# Patient Record
Sex: Female | Born: 1960 | Race: White | Hispanic: No | Marital: Married | Smoking: Former smoker
Health system: Southern US, Community
[De-identification: ages and names within clinical notes are randomized; demographics above are authoritative.]

## PROBLEM LIST (undated history)

## (undated) DIAGNOSIS — Z808 Family history of malignant neoplasm of other organs or systems: Secondary | ICD-10-CM

## (undated) DIAGNOSIS — F32A Depression, unspecified: Secondary | ICD-10-CM

## (undated) DIAGNOSIS — Z800 Family history of malignant neoplasm of digestive organs: Secondary | ICD-10-CM

## (undated) DIAGNOSIS — C50919 Malignant neoplasm of unspecified site of unspecified female breast: Secondary | ICD-10-CM

## (undated) DIAGNOSIS — F419 Anxiety disorder, unspecified: Secondary | ICD-10-CM

## (undated) DIAGNOSIS — F329 Major depressive disorder, single episode, unspecified: Secondary | ICD-10-CM

## (undated) DIAGNOSIS — Z8049 Family history of malignant neoplasm of other genital organs: Secondary | ICD-10-CM

## (undated) DIAGNOSIS — I10 Essential (primary) hypertension: Secondary | ICD-10-CM

## (undated) HISTORY — PX: OTHER SURGICAL HISTORY: SHX169

## (undated) HISTORY — PX: MASTECTOMY: SHX3

## (undated) HISTORY — DX: Family history of malignant neoplasm of other organs or systems: Z80.8

## (undated) HISTORY — DX: Family history of malignant neoplasm of other genital organs: Z80.49

## (undated) HISTORY — DX: Family history of malignant neoplasm of digestive organs: Z80.0

## (undated) HISTORY — DX: Anxiety disorder, unspecified: F41.9

## (undated) HISTORY — PX: KNEE ARTHROSCOPY: SUR90

## (undated) HISTORY — DX: Depression, unspecified: F32.A

## (undated) HISTORY — PX: TONSILLECTOMY: SUR1361

---
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
## 2019-12-05 ENCOUNTER — Other Ambulatory Visit: Payer: Self-pay | Admitting: Unknown Physician Specialty

## 2019-12-05 DIAGNOSIS — R921 Mammographic calcification found on diagnostic imaging of breast: Secondary | ICD-10-CM

## 2019-12-05 DIAGNOSIS — N6310 Unspecified lump in the right breast, unspecified quadrant: Secondary | ICD-10-CM

## 2019-12-20 ENCOUNTER — Ambulatory Visit
Admission: RE | Admit: 2019-12-20 | Discharge: 2019-12-20 | Disposition: A | Discharge status: Home / Self Care | Payer: BC Managed Care – PPO | Source: Ambulatory Visit | Attending: Unknown Physician Specialty | Admitting: Unknown Physician Specialty

## 2019-12-20 ENCOUNTER — Other Ambulatory Visit: Payer: Self-pay

## 2019-12-20 ENCOUNTER — Other Ambulatory Visit: Payer: Self-pay | Admitting: Unknown Physician Specialty

## 2019-12-20 DIAGNOSIS — R921 Mammographic calcification found on diagnostic imaging of breast: Secondary | ICD-10-CM

## 2019-12-20 DIAGNOSIS — N6310 Unspecified lump in the right breast, unspecified quadrant: Secondary | ICD-10-CM

## 2019-12-20 IMAGING — US US  BREAST BX W/ LOC DEV 1ST LESION IMG BX SPEC US GUIDE*R*
1 series · 12 of 18 positions shown · non-contrast
Comparison: Previous exam(s).
COMPARISON: Previous exam(s).

Addendum:
CLINICAL DATA: Patient with indeterminate right breast masses.

EXAM:
ULTRASOUND GUIDED RIGHT BREAST CORE NEEDLE BIOPSY

[Series 1: us breast bx w/ loc dev 1st lesion img bx spec us  · 0.06mm/px · 12 of 18 slices shown]
[im 1/18]
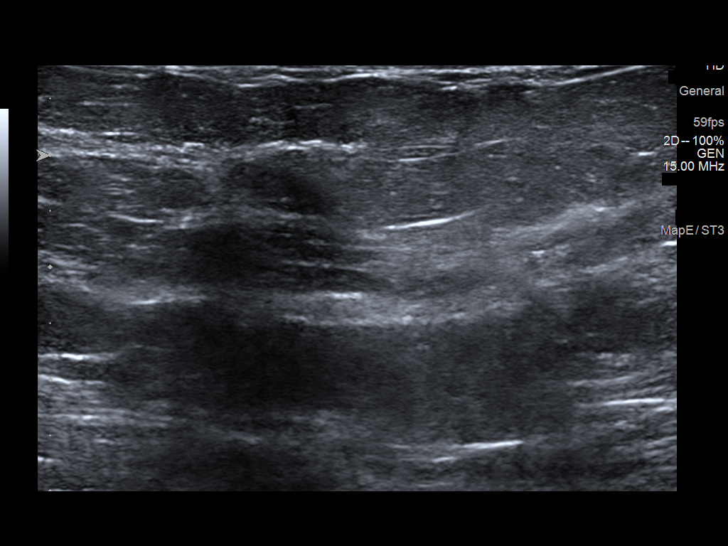
[im 3/18]
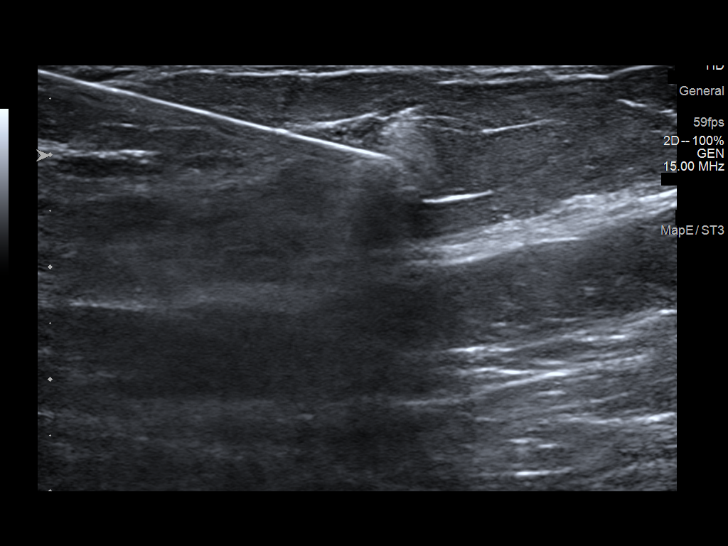
[im 4/18]
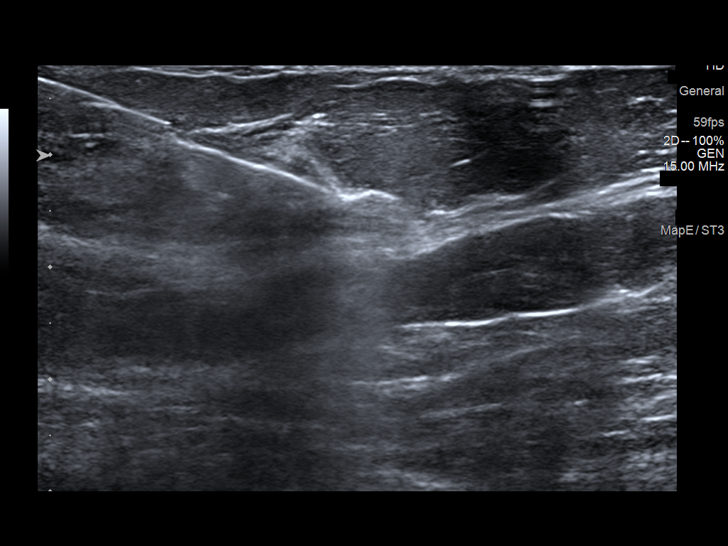
[im 6/18]
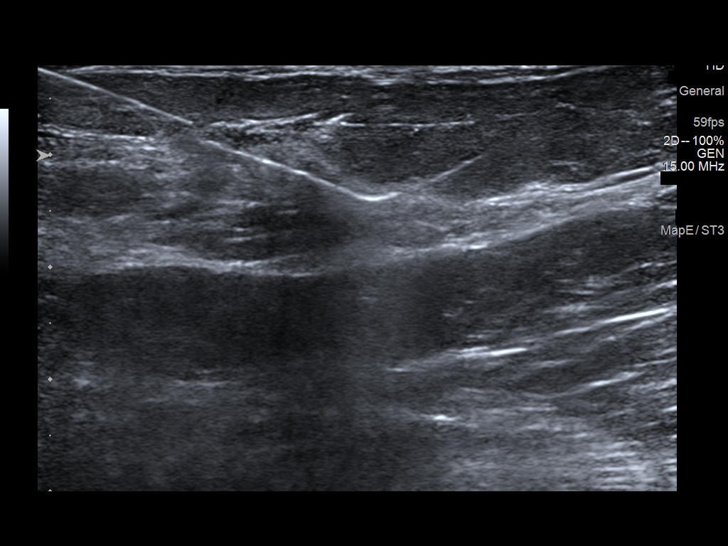
[im 7/18]
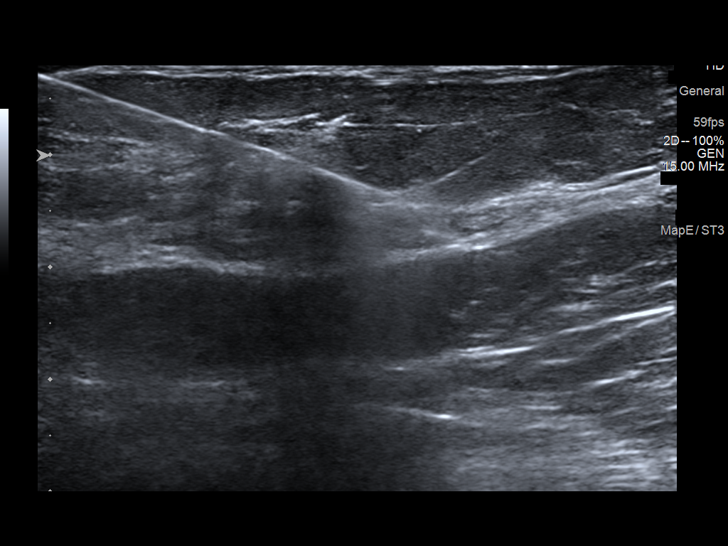
[im 9/18]
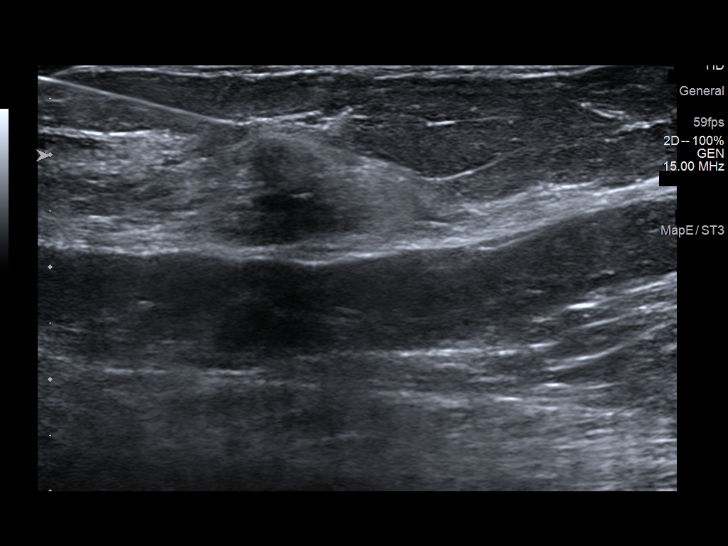
[im 10/18]
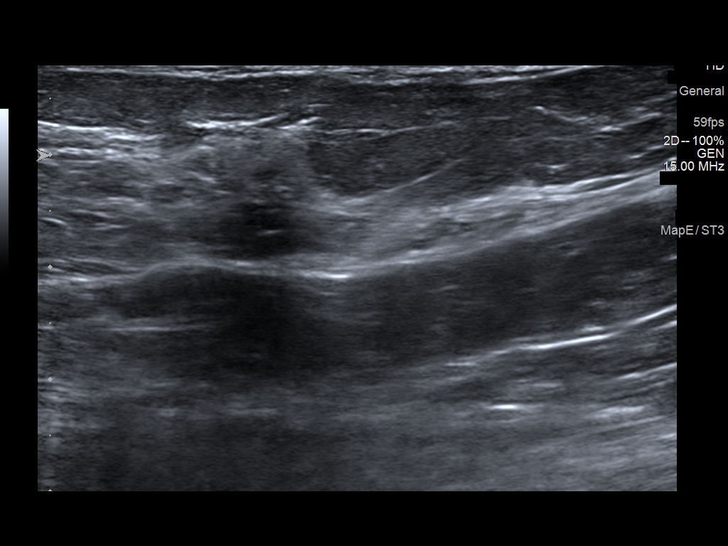
[im 12/18]
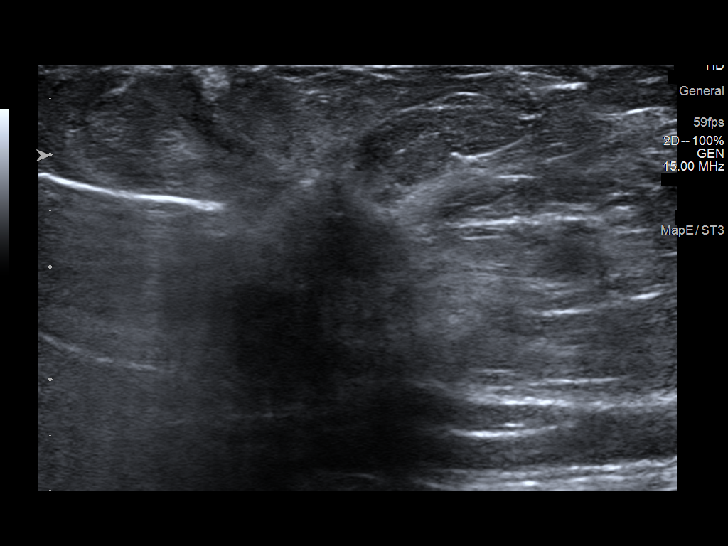
[im 13/18]
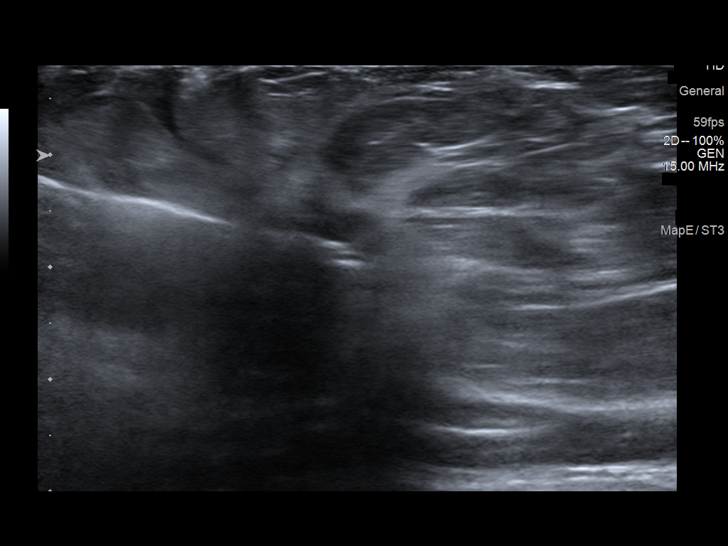
[im 15/18]
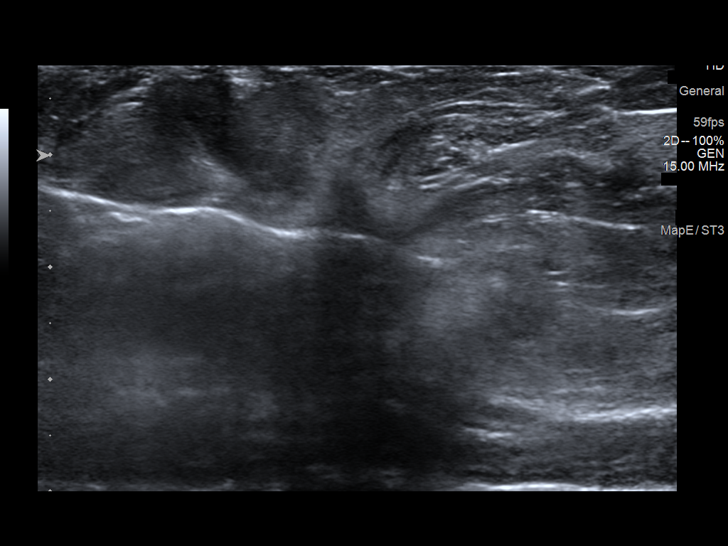
[im 16/18]
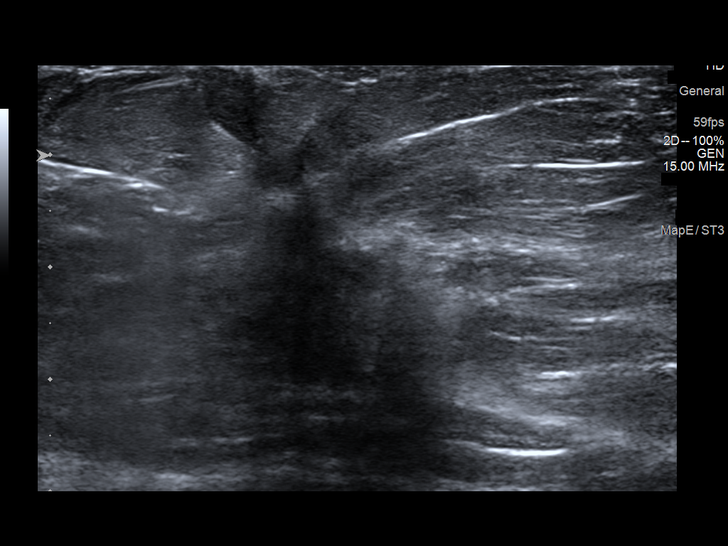
[im 18/18]
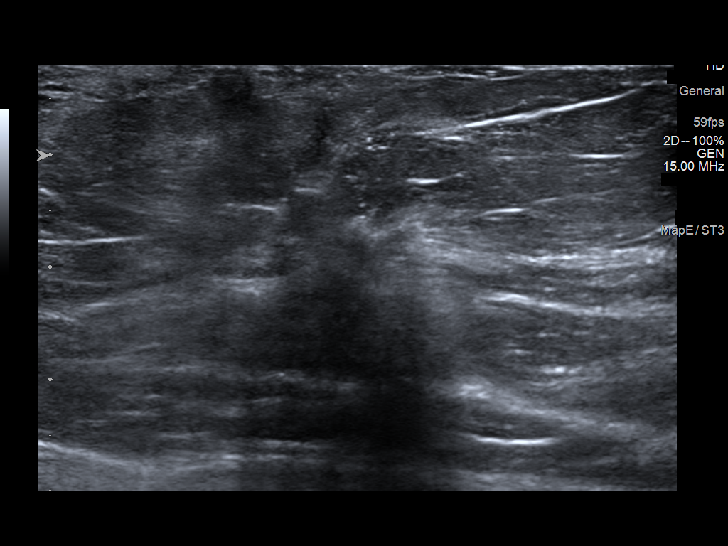

[12 of 18 positions shown; findings below may reference images not displayed]



Site 1: Right breast 7:30 o'clock: Ribbon shaped clip

Lesion quadrant: Upper outer quadrant

Using sterile technique and 1% Lidocaine as local anesthetic, under
direct ultrasound visualization, a 14 gauge SOZA device was
used to perform biopsy of right breast mass 7:30 o'clock using a
lateral approach. At the conclusion of the procedure ribbon shaped
tissue marker clip was deployed into the biopsy cavity. Follow up 2
view mammogram was performed and dictated separately.

Site 2: Right breast 9 o'clock: Coil shaped clip

Lesion quadrant: Upper outer quadrant

Using sterile technique and 1% Lidocaine as local anesthetic, under
direct ultrasound visualization, a 14 gauge SOZA device was
used to perform biopsy of right breast mass 9 o'clock position using
a lateral approach. At the conclusion of the procedure coil shaped
tissue marker clip was deployed into the biopsy cavity. Follow up 2
view mammogram was performed and dictated separately.
IMPRESSION: Ultrasound guided biopsy of right breast masses 7:30 o'clock and 9
o'clock position. No apparent complications.

ADDENDUM:
Pathology revealed FIBROCYSTIC CHANGES of the RIGHT breast, 7:30
o'clock, [7A], ribbon clip. This was found to be concordant by Dr.
SOZA.

Pathology revealed GRADE II INVASIVE MAMMARY CARCINOMA of the RIGHT
breast, 9 o'clock, 5 cmfn, coil clip. This was found to be
concordant by Dr. SOZA.

Pathology revealed INVASIVE MAMMARY CARCINOMA, ATYPICAL LOBULAR
HYPERPLASIA WITH CALCIFICATIONS of the RIGHT breast, outer, x clip.
This was found to be concordant by Dr. SOZA.

Pathology results were discussed with the patient by telephone. The
patient reported doing well after the biopsies with tenderness at
the sites. Post biopsy instructions and care were reviewed and
questions were answered. The patient was encouraged to call The

As requested, the patient was referred to [REDACTED]
[REDACTED] at [REDACTED] on
[DATE].

Pathology results reported by SOZA RN on [DATE].



Site 1: Right breast 7:30 o'clock: Ribbon shaped clip

Lesion quadrant: Upper outer quadrant

Using sterile technique and 1% Lidocaine as local anesthetic, under
direct ultrasound visualization, a 14 gauge SOZA device was
used to perform biopsy of right breast mass 7:30 o'clock using a
lateral approach. At the conclusion of the procedure ribbon shaped
tissue marker clip was deployed into the biopsy cavity. Follow up 2
view mammogram was performed and dictated separately.

Site 2: Right breast 9 o'clock: Coil shaped clip

Lesion quadrant: Upper outer quadrant

Using sterile technique and 1% Lidocaine as local anesthetic, under
direct ultrasound visualization, a 14 gauge SOZA device was
used to perform biopsy of right breast mass 9 o'clock position using
a lateral approach. At the conclusion of the procedure coil shaped
tissue marker clip was deployed into the biopsy cavity. Follow up 2
view mammogram was performed and dictated separately.
IMPRESSION: Ultrasound guided biopsy of right breast masses 7:30 o'clock and 9
o'clock position. No apparent complications.

## 2019-12-20 IMAGING — MG MM BREAST LOCALIZATION CLIP
4 series · 4 of 12 positions shown · non-contrast
Comparison: Previous exam(s).

CLINICAL DATA: Patient status post ultrasound-guided biopsy right
breast mass 7:30 o'clock and 9 o'clock position. Stereotactic guided
biopsy right breast calcifications.

EXAM:
DIAGNOSTIC RIGHT MAMMOGRAM POST STEREOTACTIC AND ULTRASOUND BIOPSY

[R LM synth-2D]
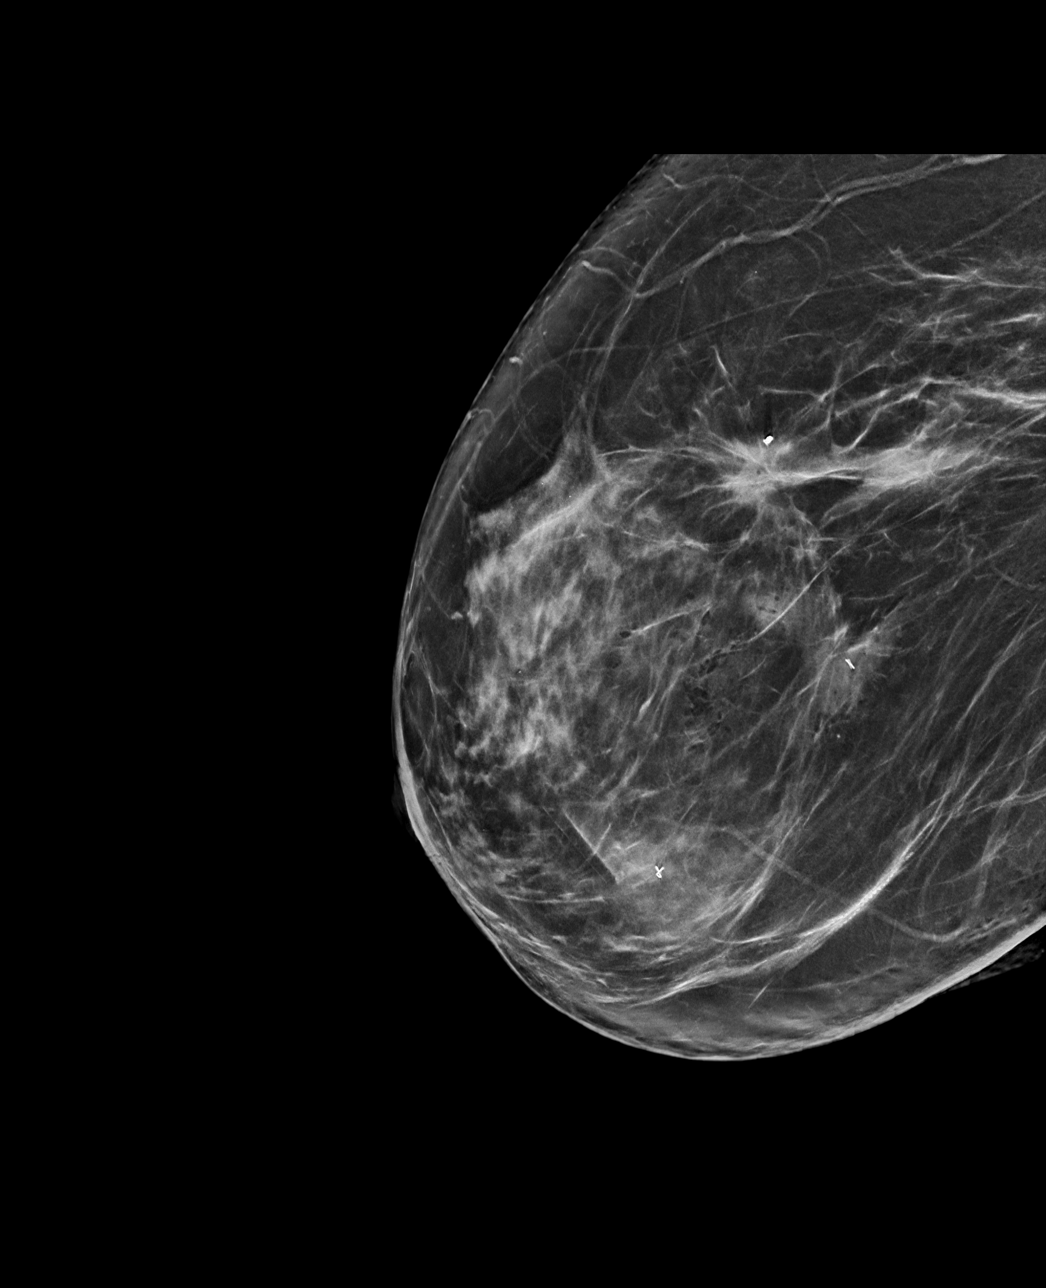

[R CC synth-2D]
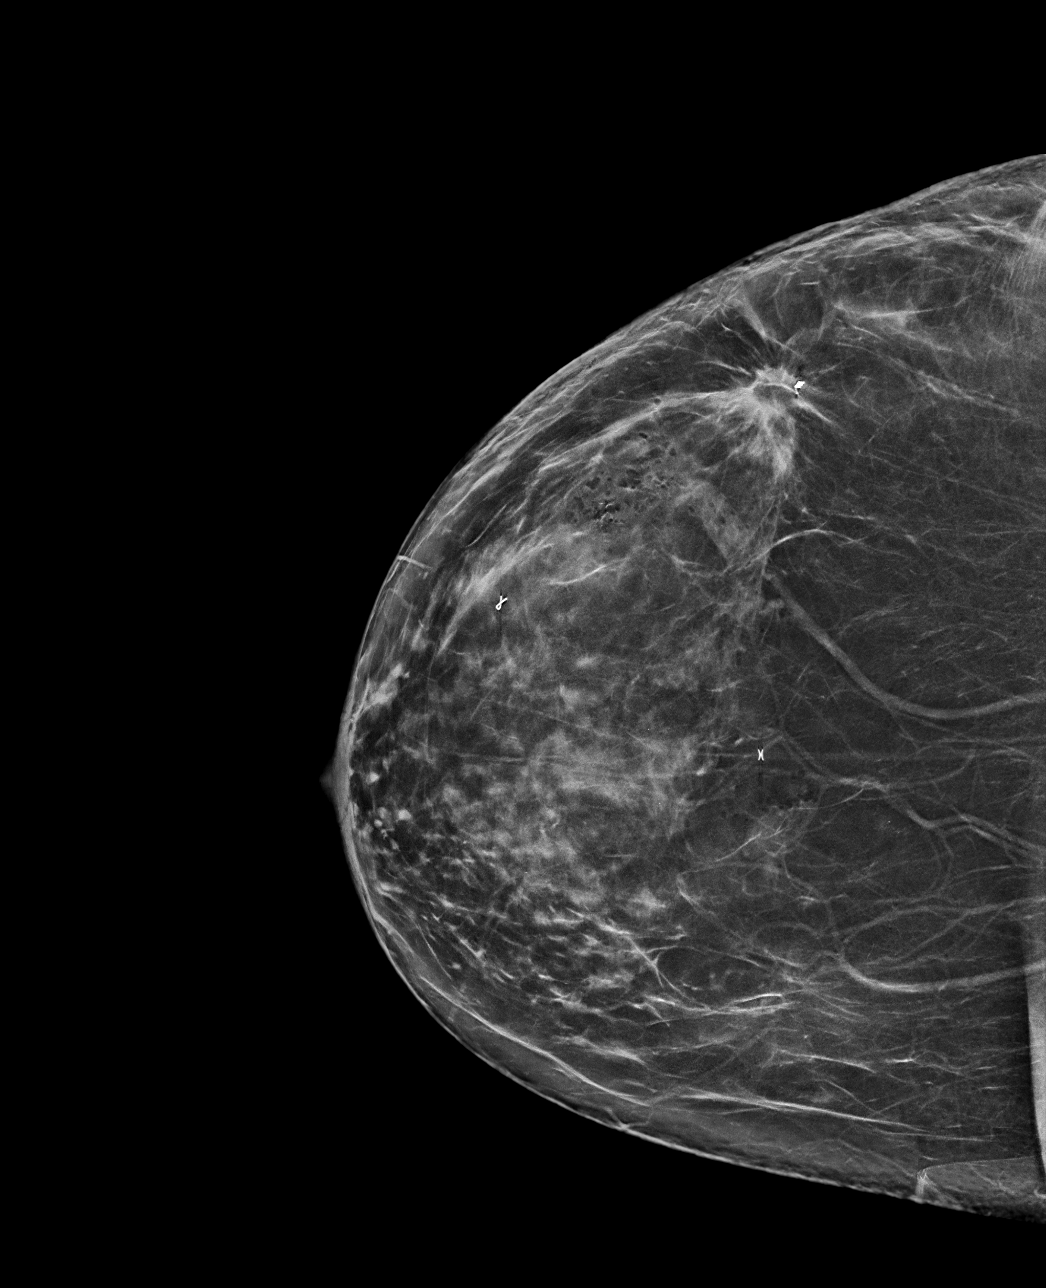

[R CC tomo · tomo slice 41/82.0]
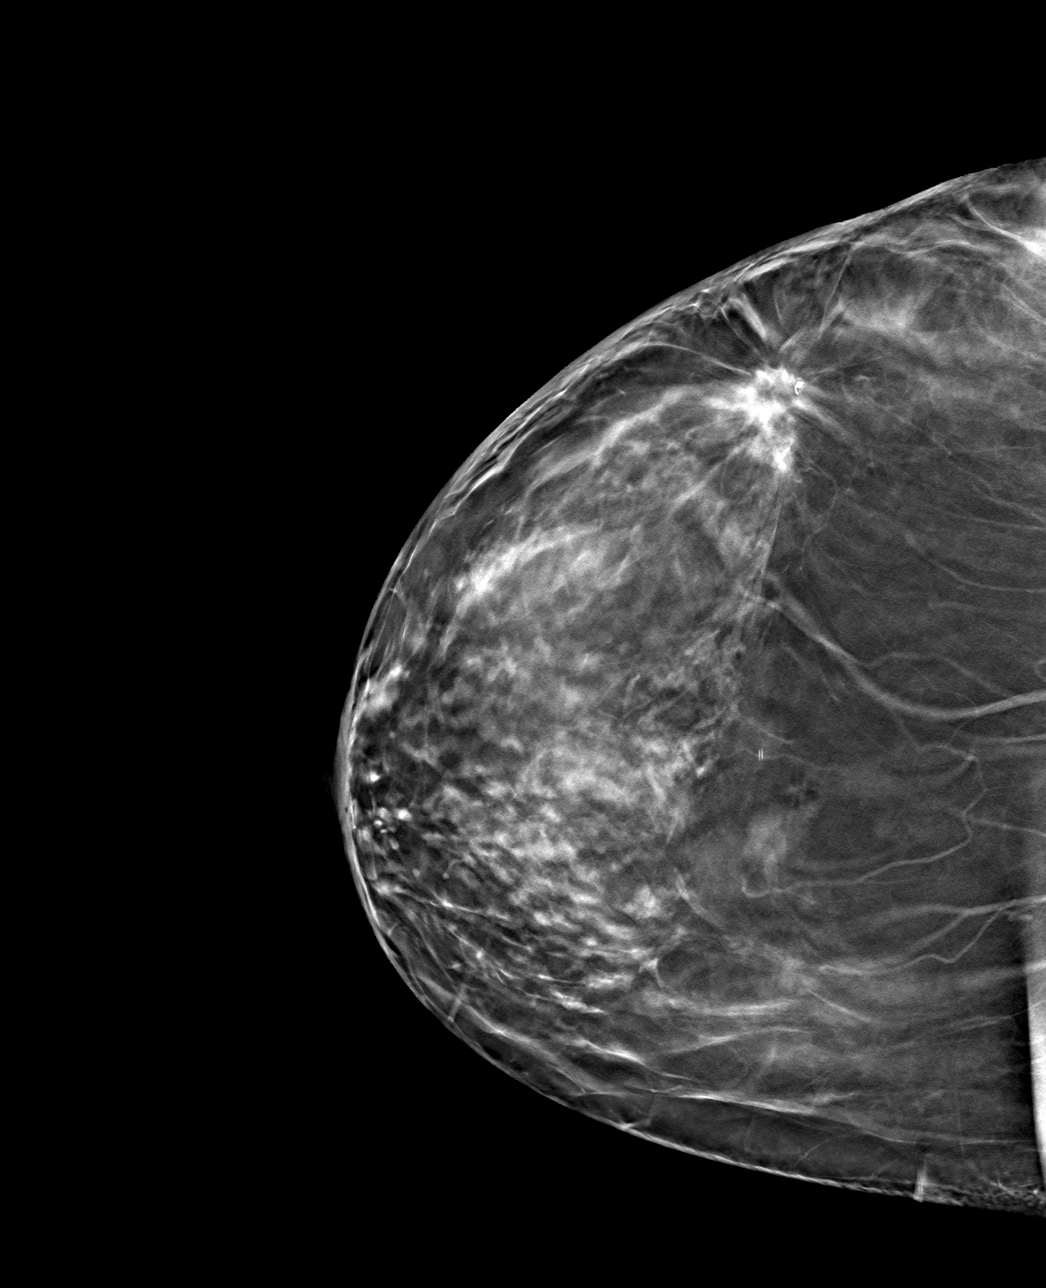

[R LM tomo · tomo slice 43/86.0]
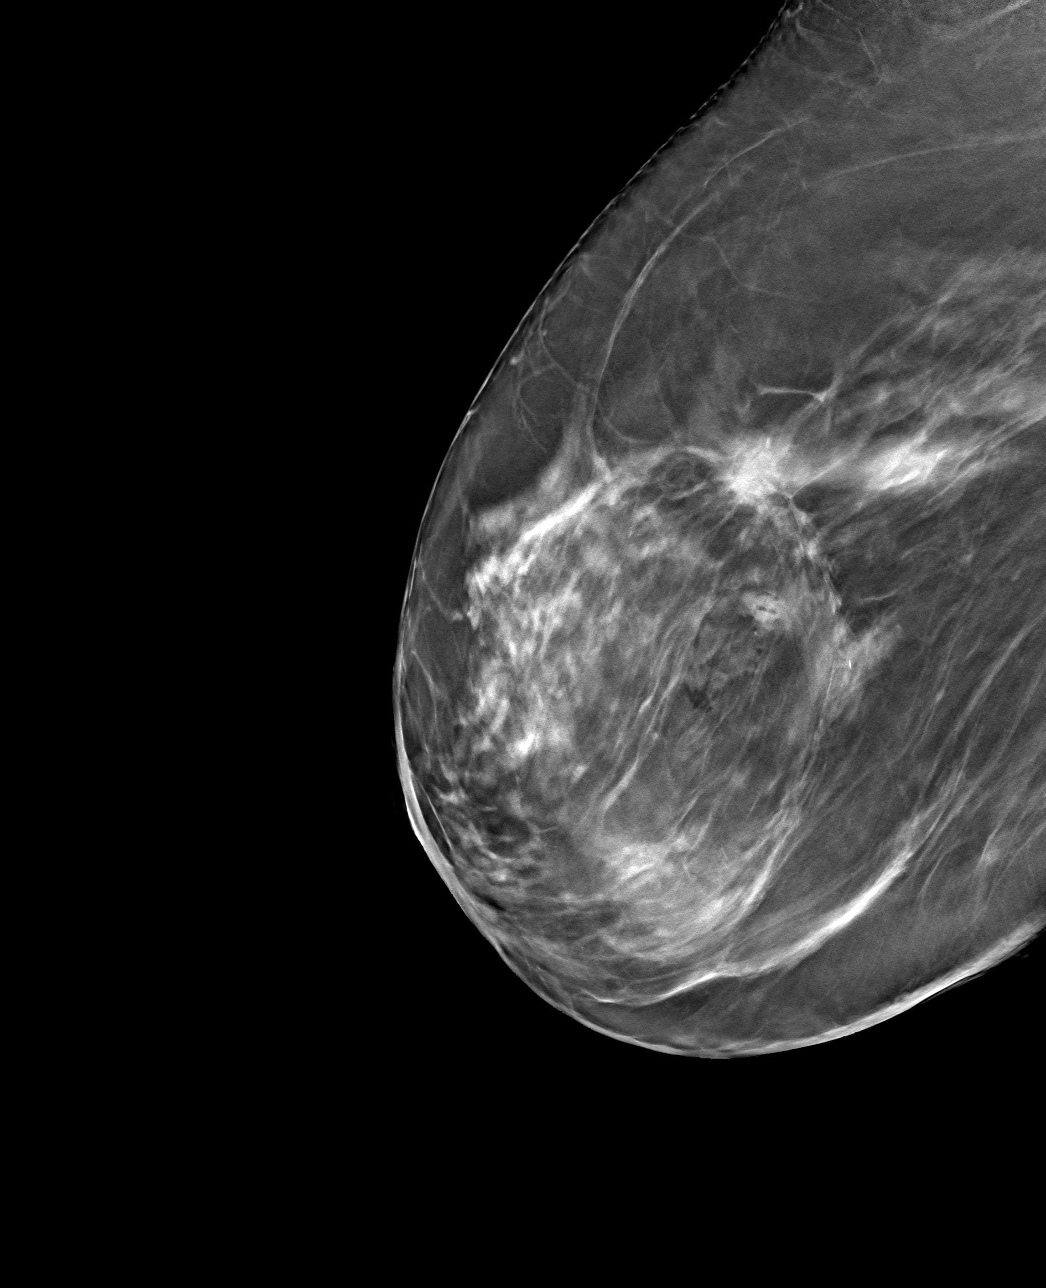

[4 of 12 positions shown; findings below may reference images not displayed]

FINDINGS: Site 1: Right breast 7:30 o'clock mass: Ribbon shaped clip: In
appropriate position.

Site 2: Right breast 9 o'clock mass: Coil shaped clip: In
appropriate position.

Site 3: Right breast calcifications: X shaped clip: Migrated
approximately 4 cm medial to the site of biopsied calcifications.
IMPRESSION: Note the X shaped clip has migrated approximately 4 cm medial to the
expected site of biopsied calcifications.

The coil shaped and ribbon shaped clips are in appropriate position.

Final Assessment: Post Procedure Mammograms for Marker Placement

## 2019-12-21 ENCOUNTER — Telehealth: Payer: Self-pay | Admitting: Hematology

## 2019-12-21 ENCOUNTER — Encounter: Payer: Self-pay | Admitting: *Deleted

## 2019-12-21 NOTE — Telephone Encounter (Signed)
LVM for patient for PM clinic on 6/2, will send packet through email

## 2019-12-25 ENCOUNTER — Encounter: Payer: Self-pay | Admitting: *Deleted

## 2019-12-25 ENCOUNTER — Other Ambulatory Visit: Payer: Self-pay

## 2019-12-25 DIAGNOSIS — C801 Malignant (primary) neoplasm, unspecified: Secondary | ICD-10-CM

## 2019-12-25 DIAGNOSIS — C50411 Malignant neoplasm of upper-outer quadrant of right female breast: Secondary | ICD-10-CM | POA: Insufficient documentation

## 2019-12-25 DIAGNOSIS — Z170 Estrogen receptor positive status [ER+]: Secondary | ICD-10-CM

## 2019-12-25 HISTORY — DX: Malignant (primary) neoplasm, unspecified: C80.1

## 2019-12-26 ENCOUNTER — Encounter: Payer: Self-pay | Admitting: *Deleted

## 2019-12-26 ENCOUNTER — Ambulatory Visit (HOSPITAL_BASED_OUTPATIENT_CLINIC_OR_DEPARTMENT_OTHER): Payer: BC Managed Care – PPO | Admitting: Genetic Counselor

## 2019-12-26 ENCOUNTER — Ambulatory Visit
Admission: RE | Admit: 2019-12-26 | Discharge: 2019-12-26 | Disposition: A | Discharge status: Home / Self Care | Payer: BC Managed Care – PPO | Source: Ambulatory Visit | Attending: Radiation Oncology | Admitting: Radiation Oncology

## 2019-12-26 ENCOUNTER — Encounter: Payer: Self-pay | Admitting: Physical Therapy

## 2019-12-26 ENCOUNTER — Encounter: Payer: Self-pay | Admitting: Hematology

## 2019-12-26 ENCOUNTER — Ambulatory Visit: Payer: BC Managed Care – PPO | Attending: Surgery | Admitting: Physical Therapy

## 2019-12-26 ENCOUNTER — Ambulatory Visit: Payer: Self-pay | Admitting: Surgery

## 2019-12-26 ENCOUNTER — Inpatient Hospital Stay: Payer: BC Managed Care – PPO

## 2019-12-26 ENCOUNTER — Inpatient Hospital Stay: Payer: BC Managed Care – PPO | Attending: Hematology | Admitting: Hematology

## 2019-12-26 ENCOUNTER — Other Ambulatory Visit: Payer: Self-pay

## 2019-12-26 VITALS — BP 142/86 | HR 91 | Temp 98.9°F | Resp 20 | Ht 68.0 in | Wt 246.6 lb

## 2019-12-26 DIAGNOSIS — F419 Anxiety disorder, unspecified: Secondary | ICD-10-CM | POA: Insufficient documentation

## 2019-12-26 DIAGNOSIS — Z800 Family history of malignant neoplasm of digestive organs: Secondary | ICD-10-CM

## 2019-12-26 DIAGNOSIS — Z1379 Encounter for other screening for genetic and chromosomal anomalies: Secondary | ICD-10-CM

## 2019-12-26 DIAGNOSIS — Z170 Estrogen receptor positive status [ER+]: Secondary | ICD-10-CM | POA: Insufficient documentation

## 2019-12-26 DIAGNOSIS — C50911 Malignant neoplasm of unspecified site of right female breast: Secondary | ICD-10-CM

## 2019-12-26 DIAGNOSIS — F17210 Nicotine dependence, cigarettes, uncomplicated: Secondary | ICD-10-CM | POA: Diagnosis not present

## 2019-12-26 DIAGNOSIS — Z8049 Family history of malignant neoplasm of other genital organs: Secondary | ICD-10-CM

## 2019-12-26 DIAGNOSIS — R293 Abnormal posture: Secondary | ICD-10-CM | POA: Diagnosis present

## 2019-12-26 DIAGNOSIS — F329 Major depressive disorder, single episode, unspecified: Secondary | ICD-10-CM | POA: Diagnosis not present

## 2019-12-26 DIAGNOSIS — Z808 Family history of malignant neoplasm of other organs or systems: Secondary | ICD-10-CM

## 2019-12-26 DIAGNOSIS — Z8541 Personal history of malignant neoplasm of cervix uteri: Secondary | ICD-10-CM | POA: Insufficient documentation

## 2019-12-26 DIAGNOSIS — C50411 Malignant neoplasm of upper-outer quadrant of right female breast: Secondary | ICD-10-CM

## 2019-12-26 DIAGNOSIS — M1990 Unspecified osteoarthritis, unspecified site: Secondary | ICD-10-CM | POA: Diagnosis not present

## 2019-12-26 DIAGNOSIS — Z791 Long term (current) use of non-steroidal anti-inflammatories (NSAID): Secondary | ICD-10-CM | POA: Insufficient documentation

## 2019-12-26 LAB — CBC WITH DIFFERENTIAL (CANCER CENTER ONLY)
Abs Immature Granulocytes: 0.03 10*3/uL (ref 0.00–0.07)
Basophils Absolute: 0 10*3/uL (ref 0.0–0.1)
Basophils Relative: 0 %
Eosinophils Absolute: 0.2 10*3/uL (ref 0.0–0.5)
Eosinophils Relative: 2 %
HCT: 44.8 % (ref 36.0–46.0)
Hemoglobin: 15.1 g/dL — ABNORMAL HIGH (ref 12.0–15.0)
Immature Granulocytes: 0 %
Lymphocytes Relative: 28 %
Lymphs Abs: 2.3 10*3/uL (ref 0.7–4.0)
MCH: 34.7 pg — ABNORMAL HIGH (ref 26.0–34.0)
MCHC: 33.7 g/dL (ref 30.0–36.0)
MCV: 103 fL — ABNORMAL HIGH (ref 80.0–100.0)
Monocytes Absolute: 0.5 10*3/uL (ref 0.1–1.0)
Monocytes Relative: 7 %
Neutro Abs: 4.9 10*3/uL (ref 1.7–7.7)
Neutrophils Relative %: 63 %
Platelet Count: 272 10*3/uL (ref 150–400)
RBC: 4.35 MIL/uL (ref 3.87–5.11)
RDW: 12.4 % (ref 11.5–15.5)
WBC Count: 8 10*3/uL (ref 4.0–10.5)
nRBC: 0 % (ref 0.0–0.2)

## 2019-12-26 LAB — CMP (CANCER CENTER ONLY)
ALT: 53 U/L — ABNORMAL HIGH (ref 0–44)
AST: 30 U/L (ref 15–41)
Albumin: 4.3 g/dL (ref 3.5–5.0)
Alkaline Phosphatase: 67 U/L (ref 38–126)
Anion gap: 10 (ref 5–15)
BUN: 17 mg/dL (ref 6–20)
CO2: 26 mmol/L (ref 22–32)
Calcium: 10.4 mg/dL — ABNORMAL HIGH (ref 8.9–10.3)
Chloride: 106 mmol/L (ref 98–111)
Creatinine: 0.94 mg/dL (ref 0.44–1.00)
GFR, Est AFR Am: >60 mL/min (ref >60)
GFR, Estimated: >60 mL/min (ref >60)
Glucose, Bld: 100 mg/dL — ABNORMAL HIGH (ref 70–99)
Potassium: 4.2 mmol/L (ref 3.5–5.1)
Sodium: 142 mmol/L (ref 135–145)
Total Bilirubin: 0.6 mg/dL (ref 0.3–1.2)
Total Protein: 7.6 g/dL (ref 6.5–8.1)

## 2019-12-26 LAB — GENETIC SCREENING ORDER

## 2019-12-26 NOTE — H&P (Signed)
History of Present Illness Cynthia Shaw. Cynthia Klima MD; 12/26/2019 2:25 PM) The patient is a 59 year old female who presents with breast cancer. [facility] PCP - [name], MD - [name]  This is a 59 year old female who presents after a recent screening mammogram revealed a couple of suspicious areas in the [place]. She had not had a mammogram in over 10 years. Her sister had surgery for "pre-cancerous" breast mass.  She underwent further work-up including biopsies of three different areas. One mass at 0730 4cmfn showed only benign fibrocystic changes. Another mass at 0900 5 cmfn revealed invasive lobular carcinoma ER/PR +, Her2 -, Ki-67 15%. Another area in the RUOQ revealed a small cluster of calcifications and biopsy showed a similar ILC. Prior to mammogram, she did have occasional twinges of pain but no palpable masses.   The patient is otherwise healthy, other than smoking a few cigarettes a day. She is trying to quit.  CLINICAL DATA: Patient with indeterminate right breast masses.  EXAM: ULTRASOUND GUIDED RIGHT BREAST CORE NEEDLE BIOPSY  COMPARISON: Previous exam(s).  PROCEDURE: I met with the patient and we discussed the procedure of ultrasound-guided biopsy, including benefits and alternatives. We discussed the high likelihood of a successful procedure. We discussed the risks of the procedure, including infection, bleeding, tissue injury, clip migration, and inadequate sampling. Informed written consent was given. The usual time-out protocol was performed immediately prior to the procedure.  Site 1: Right breast 7:30 o'clock: Ribbon shaped clip  Lesion quadrant: Upper outer quadrant  Using sterile technique and 1% Lidocaine as local anesthetic, under direct ultrasound visualization, a 14 gauge spring-loaded device was used to perform biopsy of right breast mass 7:30 o'clock using a lateral approach. At the conclusion of the procedure ribbon shaped tissue  marker clip was deployed into the biopsy cavity. Follow up 2 view mammogram was performed and dictated separately.  Site 2: Right breast 9 o'clock: Coil shaped clip  Lesion quadrant: Upper outer quadrant  Using sterile technique and 1% Lidocaine as local anesthetic, under direct ultrasound visualization, a 14 gauge spring-loaded device was used to perform biopsy of right breast mass 9 o'clock position using a lateral approach. At the conclusion of the procedure coil shaped tissue marker clip was deployed into the biopsy cavity. Follow up 2 view mammogram was performed and dictated separately.  IMPRESSION: Ultrasound guided biopsy of right breast masses 7:30 o'clock and 9 o'clock position. No apparent complications.  Electronically Signed: By: Lovey Newcomer M.D. On: 12/20/2019 09:15  CLINICAL DATA: Patient with indeterminate right breast calcifications.  EXAM: RIGHT BREAST STEREOTACTIC CORE NEEDLE BIOPSY  COMPARISON: Previous exams.  FINDINGS: The patient and I discussed the procedure of stereotactic-guided biopsy including benefits and alternatives. We discussed the high likelihood of a successful procedure. We discussed the risks of the procedure including infection, bleeding, tissue injury, clip migration, and inadequate sampling. Informed written consent was given. The usual time out protocol was performed immediately prior to the procedure.  Using sterile technique and 1% Lidocaine as local anesthetic, under stereotactic guidance, a 9 gauge vacuum assisted device was used to perform core needle biopsy of calcifications within the outer right breast using a lateral approach. Specimen radiograph was performed showing calcifications. Specimens with calcifications are identified for pathology.  Lesion quadrant: Upper outer quadrant  At the conclusion of the procedure, X shaped tissue marker clip was deployed into the biopsy cavity. Follow-up 2-view mammogram  was performed and dictated separately.  IMPRESSION: Stereotactic-guided  biopsy of right breast calcifications. No apparent complications.  Electronically Signed: By: Lovey Newcomer M.D. On: 12/20/2019 09:13  CLINICAL DATA: Patient status post ultrasound-guided biopsy right breast mass 7:30 o'clock and 9 o'clock position. Stereotactic guided biopsy right breast calcifications.  EXAM: DIAGNOSTIC RIGHT MAMMOGRAM POST STEREOTACTIC AND ULTRASOUND BIOPSY  COMPARISON: Previous exam(s).  FINDINGS: Site 1: Right breast 7:30 o'clock mass: Ribbon shaped clip: In appropriate position.  Site 2: Right breast 9 o'clock mass: Coil shaped clip: In appropriate position.  Site 3: Right breast calcifications: X shaped clip: Migrated approximately 4 cm medial to the site of biopsied calcifications.  IMPRESSION: Note the X shaped clip has migrated approximately 4 cm medial to the expected site of biopsied calcifications.  The coil shaped and ribbon shaped clips are in appropriate position.  Final Assessment: Post Procedure Mammograms for Marker Placement   Electronically Signed By: Lovey Newcomer M.D. On: 12/20/2019 09:25   Problem List/Past Medical Rodman Key K. 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 Lorraina Spring MD; 12/26/2019 2:30 PM)  The physical exam findings are as follows: Note:Constitutional: WDWN in NAD, conversant, no obvious deformities; resting comfortably Eyes: Pupils equal, round; sclera anicteric; moist conjunctiva; no lid lag HENT: Oral mucosa moist; good dentition Neck: No masses palpated, trachea midline; no thyromegaly Lungs: CTA bilaterally; normal respiratory effort Breasts: symmetric except for some  bruising in the lateral right breast; no dominant masses, no nipple changes, no axillary or supraclavicular lymphadenopathy CV: Regular rate and rhythm; no murmurs; extremities well-perfused with no edema Abd: +bowel sounds, soft, non-tender, no palpable organomegaly; no palpable hernias Musc: Normal gait; no apparent clubbing or cyanosis in extremities Lymphatic: No palpable cervical or axillary lymphadenopathy Skin: Warm, dry; no sign of jaundice Psychiatric - alert and oriented x 4; calm mood and affect    Assessment & Plan Rodman Key K. 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 The surgical procedure has been discussed with the patient. Potential risks, benefits, alternative treatments, and expected outcomes have been explained. All of the patient's questions at this time have been answered. The likelihood of reaching the patient's treatment goal is good. The patient understand the proposed surgical procedure and wishes to proceed.   If MRI or Genetics reveal any abnormalities, we may have to change our surgical plan.  Note:We discussed surgical options, including mastectomy vs. lumpectomy with sentinel lymph node biopsy. We discussed options for mastectomy, including immediate reconstruction and nipple-sparing mastectomy. The patient is seeing Genetics, which may change our surgical plan. We will also obtain an MRI to rule out other suspicious areas because she is presenting with two different areas of malignancy. This may also change our surgical plan.   If Genetics and MRI are both otherwise negative, we will proceed with breast conserving therapy.  R RSL/  SLNB XRT Hormones Oncotype   Cynthia Shaw. Georgette Dover, MD, [facility] Surgery  General/ Trauma Surgery   12/26/2019 2:36 PM

## 2019-12-26 NOTE — Therapy (Signed)
[place], [place], [address] Phone: [phone]   Fax:  [phone]  Physical Therapy Evaluation  Patient Details  Name: Cynthia Shaw MRN: [id number] Date of Birth: March 30, 1961 Referring Provider (PT): Dr. Donnie Mesa   Encounter Date: 12/26/2019  PT End of Session - 12/26/19 1456    Visit Number  1    Number of Visits  2    Date for PT Re-Evaluation  02/20/20    PT Start Time  2637    PT Stop Time  1420    PT Time Calculation (min)  37 min    Activity Tolerance  Patient tolerated treatment well    Behavior During Therapy  [facility] for tasks assessed/performed       Past Medical History:  Diagnosis Date  . Anxiety   . Depression     Past Surgical History:  Procedure Laterality Date  . c sections     . CESAREAN SECTION      There were no vitals filed for this visit.   Subjective Assessment - 12/26/19 1427    Subjective  Patient reports she is here today to be seen by her medical team for her newly diagnosed right breast cancer.    Patient is accompained by:  Family member    Pertinent History  Patient was diagnosed on 11/22/2019 with right invasive lobular carcinoma breast cancer. It measures 2.6 cm and 1 cm in the upper outer quadrant. It is ER/PR positive and HER2 negative with a ki67 of 20%. She smokes about 5 cigarettes per day.    Patient Stated Goals  Reduce lymphedema risk and learn post op shoulder ROM HEP    Currently in Pain?  Yes    Pain Score  3     Pain Location  Wrist    Pain Orientation  Right    Pain Descriptors / Indicators  Aching    Pain Type  Acute pain    Pain Onset  In the past 7 days    Pain Frequency  Intermittent    Aggravating Factors   Thinks she sprained her wrist lifting something; increased pain with lifitng    Pain Relieving Factors  Rest    Multiple Pain Sites  No         [facility] PT Assessment - 12/26/19 0001      Assessment   Medical Diagnosis   Right breast cancer    Referring Provider (PT)  Dr. Donnie Mesa    Onset Date/Surgical Date  11/22/19    Hand Dominance  Left    Prior Therapy  none      Precautions   Precautions  Other (comment)    Precaution Comments  active cancer      Restrictions   Weight Bearing Restrictions  No      Balance Screen   Has the patient fallen in the past 6 months  No    Has the patient had a decrease in activity level because of a fear of falling?   No    Is the patient reluctant to leave their home because of a fear of falling?   No      Home Environment   Living Environment  Private residence    Living Arrangements  Spouse/significant other    Available Help at Discharge  Family      Prior Function   Level of Independence  Independent    Vocation  Full time employment    Vocation  Requirements  Sales for [employer]  She does not exercise      Cognition   Overall Cognitive Status  Within Functional Limits for tasks assessed      Posture/Postural Control   Posture/Postural Control  Postural limitations    Postural Limitations  Rounded Shoulders;Forward head      ROM / Strength   AROM / PROM / Strength  AROM;Strength      AROM   Overall AROM Comments  Cervical AROM is WNL    AROM Assessment Site  Shoulder    Right/Left Shoulder  Right;Left    Right Shoulder Extension  57 Degrees    Right Shoulder Flexion  147 Degrees    Right Shoulder ABduction  156 Degrees    Right Shoulder Internal Rotation  67 Degrees    Right Shoulder External Rotation  85 Degrees    Left Shoulder Extension  53 Degrees    Left Shoulder Flexion  145 Degrees    Left Shoulder ABduction  144 Degrees    Left Shoulder Internal Rotation  63 Degrees    Left Shoulder External Rotation  82 Degrees      Strength   Overall Strength  Within functional limits for tasks performed        LYMPHEDEMA/ONCOLOGY QUESTIONNAIRE - 12/26/19 1454      Type   Cancer Type  Right breast cancer      Lymphedema  Assessments   Lymphedema Assessments  Upper extremities      Right Upper Extremity Lymphedema   10 cm Proximal to Olecranon Process  30 cm    Olecranon Process  25.6 cm    10 cm Proximal to Ulnar Styloid Process  23.2 cm    Just Proximal to Ulnar Styloid Process  16.7 cm    Across Hand at [employer]  19.7 cm    At [place] of 2nd Digit  6.3 cm      Left Upper Extremity Lymphedema   10 cm Proximal to Olecranon Process  30.6 cm    Olecranon Process  26.2 cm    10 cm Proximal to Ulnar Styloid Process  24.8 cm    Just Proximal to Ulnar Styloid Process  17.7 cm    Across Hand at [employer]  19.2 cm    At [place] of 2nd Digit  6.1 cm      L-DEX FLOWSHEETS - 12/26/19 1400      L-DEX LYMPHEDEMA SCREENING   Measurement Type  Unilateral    L-DEX MEASUREMENT EXTREMITY  Upper Extremity    POSITION   Standing    DOMINANT SIDE  Left    At Risk Side  Right    BASELINE SCORE (UNILATERAL)  4.1        The patient was assessed using the L-Dex machine today to produce a lymphedema index baseline score. The patient will be reassessed on a regular basis (typically every 3 months) to obtain new L-Dex scores. If the score is > 6.5 points away from his/her baseline score indicating onset of subclinical lymphedema, it will be recommended to wear a compression garment for 4 weeks, 12 hours per day and then be reassessed. If the score continues to be > 6.5 points from baseline at reassessment, we will initiate lymphedema treatment. Assessing in this manner has a 95% rate of preventing clinically significant lymphedema.    Katina Dung - 12/26/19 0001    Open a tight or new jar  Moderate difficulty  Do heavy household chores (wash walls, wash floors)  Moderate difficulty    Carry a shopping bag or briefcase  Mild difficulty    Wash your back  No difficulty    Use a knife to cut food  No difficulty    Recreational activities in which you take some force or impact through your arm, shoulder, or hand  (golf, hammering, tennis)  Mild difficulty    During the past week, to what extent has your arm, shoulder or hand problem interfered with your normal social activities with family, friends, neighbors, or groups?  Quite a bit    During the past week, to what extent has your arm, shoulder or hand problem limited your work or other regular daily activities  Not at all    Arm, shoulder, or hand pain.  Moderate    Tingling (pins and needles) in your arm, shoulder, or hand  Moderate    Difficulty Sleeping  Mild difficulty    DASH Score  31.82 %        Objective measurements completed on examination: See above findings.       Patient was instructed today in a home exercise program today for post op shoulder range of motion. These included active assist shoulder flexion in sitting, scapular retraction, wall walking with shoulder abduction, and hands behind head external rotation.  She was encouraged to do these twice a day, holding 3 seconds and repeating 5 times when permitted by her physician.           PT Education - 12/26/19 1455    Education Details  Lymphedema risk reduction and post op shoulder ROM HEP    Person(s) Educated  Patient;Spouse    Methods  Explanation;Demonstration;Handout    Comprehension  Returned demonstration;Verbalized understanding          PT Long Term Goals - 12/26/19 1459      PT LONG TERM GOAL #1   Title  Patient will demonstrate she has regained full shoulder ROM and function post operatively compared to baselines.    Time  8    Period  Weeks    Status  New    Target Date  02/20/20      Breast Clinic Goals - 12/26/19 1459      Patient will be able to verbalize understanding of pertinent lymphedema risk reduction practices relevant to her diagnosis specifically related to skin care.   Time  1    Period  Days    Status  Achieved      Patient will be able to return demonstrate and/or verbalize understanding of the post-op home exercise program  related to regaining shoulder range of motion.   Time  1    Period  Days    Status  Achieved      Patient will be able to verbalize understanding of the importance of attending the postoperative After Breast Cancer Class for further lymphedema risk reduction education and therapeutic exercise.   Time  1    Period  Days    Status  Achieved            Plan - 12/26/19 1456    Clinical Impression Statement  Patient was diagnosed on 11/22/2019 with right invasive lobular carcinoma breast cancer. It measures 2.6 cm and 1 cm in the upper outer quadrant. It is ER/PR positive and HER2 negative with a ki67 of 20%. She smokes about 5 cigarettes per day. Her multidisciplinary medical team met prior to her assessments  to determine a recommended treatment plan. She is planning to have a right lumpectomy and sentinel node biopsy followed by Oncotype testing, radiation, and anti-estrogen therapy. She will benefit from a post op PT reassessment and L-Dex screenings every 3 months to detect subclinical lymphedema.    Stability/Clinical Decision Making  Stable/Uncomplicated    Clinical Decision Making  Low    Rehab Potential  Excellent    PT Frequency  --   Eval and 1 f/u visit post op; then L-Dex screenings every 3 months for 2 years   PT Treatment/Interventions  ADLs/Self Care Home Management;Therapeutic exercise;Patient/family education    PT Next Visit Plan  Will reassess 3-4 weeks post op to determine needs    PT Home Exercise Plan  Post op shoulder ROM HEP    Consulted and Agree with Plan of Care  Patient;Family member/caregiver    Family Member Consulted  Husband       Patient will benefit from skilled therapeutic intervention in order to improve the following deficits and impairments:  Postural dysfunction, Decreased range of motion, Impaired UE functional use, Pain, Decreased knowledge of precautions  Visit Diagnosis: Malignant neoplasm of upper-outer quadrant of right breast in female,  estrogen receptor positive ([place]) - Plan: PT plan of care cert/re-cert  Abnormal posture - Plan: PT plan of care cert/re-cert   Patient will follow up at outpatient cancer rehab 3-4 weeks following surgery.  If the patient requires physical therapy at that time, a specific plan will be dictated and sent to the referring physician for approval. The patient was educated today on appropriate basic range of motion exercises to begin post operatively and the importance of attending the After Breast Cancer class following surgery.  Patient was educated today on lymphedema risk reduction practices as it pertains to recommendations that will benefit the patient immediately following surgery.  She verbalized good understanding.      Problem List Patient Active Problem List   Diagnosis Date Noted  . Malignant neoplasm of upper-outer quadrant of right breast in female, estrogen receptor positive ([place]) 12/25/2019   Annia Friendly, PT 12/26/19 3:02 PM  [place], [place], [address] Phone: [phone]   Fax:  [phone]  Name: Cynthia Shaw MRN: [id number] Date of Birth: Dec 11, 1960

## 2019-12-26 NOTE — Patient Instructions (Signed)

## 2019-12-26 NOTE — Progress Notes (Signed)
Radiation Oncology         [phone] ________________________________  Name: Cynthia Shaw        MRN: [id number]  Date of Service: 12/26/2019 DOB: 02/28/1961  CC:Arsenio Katz, NP  Donnie Mesa, MD     REFERRING PHYSICIAN: Donnie Mesa, MD   DIAGNOSIS: The encounter diagnosis was Malignant neoplasm of upper-outer quadrant of right breast in female, estrogen receptor positive ([place]).   HISTORY OF PRESENT ILLNESS: Cynthia Shaw is a 59 y.o. female seen in the multidisciplinary breast clinic for a new diagnosis of right breast cancer. The patient was noted to have a mass and distortion in the right breast. Diagnostic imaging revealed a mass at 9:00 measuring 2.6 x 1.8 x 1.1 cm with a 3 mm area of calcifications adjacent to the to the mass and her axilla was negative for adenopathy. There was a second site as well in the breast that was evaluated at 7:30 consistent with a small mass. 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  Her tumors were ER/PR positive, HER2 was negative and her Ki 67 was 15-20%. She comes today to discuss options of treatment for her cancer.   PREVIOUS RADIATION THERAPY: No   PAST MEDICAL HISTORY:  Past Medical History:  Diagnosis Date   Anxiety    Depression        PAST SURGICAL HISTORY: Past Surgical History:  Procedure Laterality Date   c sections      CESAREAN SECTION       FAMILY HISTORY:  Family History  Problem Relation Age of Onset   Uterine cancer Sister 58   Colon cancer Maternal Grandmother      SOCIAL HISTORY:  reports that she has been smoking cigarettes. She has a 4.00 pack-year smoking history. She has never used smokeless tobacco. She reports current alcohol use. She reports that she  does not use drugs. The patient is married and lives in [place]. She works for [employer] and [employer]. She's accompanied by her husband. They have adult children from prior marriages.  ALLERGIES: Sulfa antibiotics   MEDICATIONS:  Current Outpatient Medications  Medication Sig Dispense Refill   acetaminophen (TYLENOL) 500 MG tablet Take 500 mg by mouth every 6 (six) hours as needed.     ibuprofen (ADVIL) 800 MG tablet Take by mouth.     No current facility-administered medications for this encounter.     REVIEW OF SYSTEMS: On review of systems, the patient reports that she is doing well overall. She denies bleeding or discharge from her nipple. No other complaints are noted.      PHYSICAL EXAM:  Wt Readings from Last 3 Encounters:  12/26/19 246 lb 9.6 oz (111.9 kg)   Temp Readings from Last 3 Encounters:  12/26/19 98.9 F (37.2 C) (Temporal)   BP Readings from Last 3 Encounters:  12/26/19 (!) 142/86   Pulse Readings from Last 3 Encounters:  12/26/19 91    In general this is a well appearing caucasian female in no acute distress. She's alert and oriented x4 and appropriate throughout the examination. Cardiopulmonary assessment is negative for acute distress and she exhibits normal effort. Bilateral breast exam is deferred.    ECOG = 0  0 - Asymptomatic (Fully active, able to carry on all predisease activities without  restriction)  1 - Symptomatic but completely ambulatory (Restricted in physically strenuous activity but ambulatory and able to carry out work of a light or sedentary nature. For example, light housework, office work)  2 - Symptomatic, <50% in bed during the day (Ambulatory and capable of all self care but unable to carry out any work activities. Up and about more than 50% of waking hours)  3 - Symptomatic, >50% in bed, but not bedbound (Capable of only limited self-care, confined to bed or chair 50% or more of waking hours)  4 - Bedbound (Completely  disabled. Cannot carry on any self-care. Totally confined to bed or chair)  5 - Death   Eustace Pen MM, Creech RH, Tormey DC, et al. [phone]). "Toxicity and response criteria of the [facility]". [place] Oncol. 5 (6): 649-55    LABORATORY DATA:  Lab Results  Component Value Date   WBC 8.0 12/26/2019   HGB 15.1 (H) 12/26/2019   HCT 44.8 12/26/2019   MCV 103.0 (H) 12/26/2019   PLT 272 12/26/2019   Lab Results  Component Value Date   NA 142 12/26/2019   K 4.2 12/26/2019   CL 106 12/26/2019   CO2 26 12/26/2019   Lab Results  Component Value Date   ALT 53 (H) 12/26/2019   AST 30 12/26/2019   ALKPHOS 67 12/26/2019   BILITOT 0.6 12/26/2019      RADIOGRAPHY: MM CLIP PLACEMENT RIGHT  Result Date: 12/20/2019 CLINICAL DATA:  Patient status post ultrasound-guided biopsy right breast mass 7:30 o'clock and 9 o'clock position. Stereotactic guided biopsy right breast calcifications. EXAM: DIAGNOSTIC RIGHT MAMMOGRAM POST STEREOTACTIC AND ULTRASOUND BIOPSY COMPARISON:  Previous exam(s). FINDINGS: Site 1: Right breast 7:30 o'clock mass: Ribbon shaped clip: In appropriate position. Site 2: Right breast 9 o'clock mass: Coil shaped clip: In appropriate position. Site 3: Right breast calcifications: X shaped clip: Migrated approximately 4 cm medial to the site of biopsied calcifications. IMPRESSION: Note the X shaped clip has migrated approximately 4 cm medial to the expected site of biopsied calcifications. The coil shaped and ribbon shaped clips are in appropriate position. Final Assessment: Post Procedure Mammograms for Marker Placement Electronically Signed   By: Lovey Newcomer M.D.   On: 12/20/2019 09:25   MM RT BREAST BX W LOC DEV 1ST LESION IMAGE BX SPEC STEREO GUIDE  Addendum Date: 12/25/2019   ADDENDUM REPORT: 12/21/2019 16:01 ADDENDUM: Pathology revealed FIBROCYSTIC CHANGES of the RIGHT breast, 7:30 o'clock, 4cmfn, ribbon clip. This was found to be concordant by Dr. Lovey Newcomer. Pathology revealed GRADE II INVASIVE MAMMARY CARCINOMA of the RIGHT breast, 9 o'clock, 5 cmfn, coil clip. This was found to be concordant by Dr. Lovey Newcomer. Pathology revealed INVASIVE MAMMARY CARCINOMA, ATYPICAL LOBULAR HYPERPLASIA WITH CALCIFICATIONS of the RIGHT breast, outer, x clip. This was found to be concordant by Dr. Lovey Newcomer. Pathology results were discussed with the patient by telephone. The patient reported doing well after the biopsies with tenderness at the sites. Post biopsy instructions and care were reviewed and questions were answered. The patient was encouraged to call [facility] for any additional concerns. As requested, the patient was referred to [facility] on December 26, 2019. Pathology results reported by Stacie Acres RN on 12/21/2019. Electronically Signed   By: Lovey Newcomer M.D.   On: 12/21/2019 16:01   Result Date: 12/25/2019 CLINICAL DATA:  Patient with indeterminate right breast  calcifications. EXAM: RIGHT BREAST STEREOTACTIC CORE NEEDLE BIOPSY COMPARISON:  Previous exams. FINDINGS: The patient and I discussed the procedure of stereotactic-guided biopsy including benefits and alternatives. We discussed the high likelihood of a successful procedure. We discussed the risks of the procedure including infection, bleeding, tissue injury, clip migration, and inadequate sampling. Informed written consent was given. The usual time out protocol was performed immediately prior to the procedure. Using sterile technique and 1% Lidocaine as local anesthetic, under stereotactic guidance, a 9 gauge vacuum assisted device was used to perform core needle biopsy of calcifications within the outer right breast using a lateral approach. Specimen radiograph was performed showing calcifications. Specimens with calcifications are identified for pathology. Lesion quadrant: Upper outer quadrant At the  conclusion of the procedure, X shaped tissue marker clip was deployed into the biopsy cavity. Follow-up 2-view mammogram was performed and dictated separately. IMPRESSION: Stereotactic-guided biopsy of right breast calcifications. No apparent complications. Electronically Signed: By: Lovey Newcomer M.D. On: 12/20/2019 09:13   [place] RT BREAST BX W LOC DEV 1ST LESION IMG BX SPEC US GUIDE  Addendum Date: 12/25/2019   ADDENDUM REPORT: 12/21/2019 15:59 ADDENDUM: Pathology revealed FIBROCYSTIC CHANGES of the RIGHT breast, 7:30 o'clock, 4cmfn, ribbon clip. This was found to be concordant by Dr. Lovey Newcomer. Pathology revealed GRADE II INVASIVE MAMMARY CARCINOMA of the RIGHT breast, 9 o'clock, 5 cmfn, coil clip. This was found to be concordant by Dr. Lovey Newcomer. Pathology revealed INVASIVE MAMMARY CARCINOMA, ATYPICAL LOBULAR HYPERPLASIA WITH CALCIFICATIONS of the RIGHT breast, outer, x clip. This was found to be concordant by Dr. Lovey Newcomer. Pathology results were discussed with the patient by telephone. The patient reported doing well after the biopsies with tenderness at the sites. Post biopsy instructions and care were reviewed and questions were answered. The patient was encouraged to call [facility] for any additional concerns. As requested, the patient was referred to [facility] on December 26, 2019. Pathology results reported by Stacie Acres RN on 12/21/2019. Electronically Signed   By: Lovey Newcomer M.D.   On: 12/21/2019 15:59   Result Date: 12/25/2019 CLINICAL DATA:  Patient with indeterminate right breast masses. EXAM: ULTRASOUND GUIDED RIGHT BREAST CORE NEEDLE BIOPSY COMPARISON:  Previous exam(s). PROCEDURE: I met with the patient and we discussed the procedure of ultrasound-guided biopsy, including benefits and alternatives. We discussed the high likelihood of a successful procedure. We discussed the risks of the  procedure, including infection, bleeding, tissue injury, clip migration, and inadequate sampling. Informed written consent was given. The usual time-out protocol was performed immediately prior to the procedure. Site 1: Right breast 7:30 o'clock: Ribbon shaped clip Lesion quadrant: Upper outer quadrant Using sterile technique and 1% Lidocaine as local anesthetic, under direct ultrasound visualization, a 14 gauge spring-loaded device was used to perform biopsy of right breast mass 7:30 o'clock using a lateral approach. At the conclusion of the procedure ribbon shaped tissue marker clip was deployed into the biopsy cavity. Follow up 2 view mammogram was performed and dictated separately. Site 2: Right breast 9 o'clock: Coil shaped clip Lesion quadrant: Upper outer quadrant Using sterile technique and 1% Lidocaine as local anesthetic, under direct ultrasound visualization, a 14 gauge spring-loaded device was used to perform biopsy of right breast mass 9 o'clock position using a lateral approach. At the conclusion of the procedure coil shaped tissue marker clip was deployed into the biopsy cavity. Follow up 2 view mammogram  was performed and dictated separately. IMPRESSION: Ultrasound guided biopsy of right breast masses 7:30 o'clock and 9 o'clock position. No apparent complications. Electronically Signed: By: Lovey Newcomer M.D. On: 12/20/2019 09:15   [place] RT BREAST BX W LOC DEV EA ADD LESION IMG BX SPEC US GUIDE  Addendum Date: 12/25/2019   ADDENDUM REPORT: 12/21/2019 15:59 ADDENDUM: Pathology revealed FIBROCYSTIC CHANGES of the RIGHT breast, 7:30 o'clock, 4cmfn, ribbon clip. This was found to be concordant by Dr. Lovey Newcomer. Pathology revealed GRADE II INVASIVE MAMMARY CARCINOMA of the RIGHT breast, 9 o'clock, 5 cmfn, coil clip. This was found to be concordant by Dr. Lovey Newcomer. Pathology revealed INVASIVE MAMMARY CARCINOMA, ATYPICAL LOBULAR HYPERPLASIA WITH CALCIFICATIONS of the RIGHT breast, outer, x clip. This was  found to be concordant by Dr. Lovey Newcomer. Pathology results were discussed with the patient by telephone. The patient reported doing well after the biopsies with tenderness at the sites. Post biopsy instructions and care were reviewed and questions were answered. The patient was encouraged to call [facility] for any additional concerns. As requested, the patient was referred to [facility] on December 26, 2019. Pathology results reported by Stacie Acres RN on 12/21/2019. Electronically Signed   By: Lovey Newcomer M.D.   On: 12/21/2019 15:59   Result Date: 12/25/2019 CLINICAL DATA:  Patient with indeterminate right breast masses. EXAM: ULTRASOUND GUIDED RIGHT BREAST CORE NEEDLE BIOPSY COMPARISON:  Previous exam(s). PROCEDURE: I met with the patient and we discussed the procedure of ultrasound-guided biopsy, including benefits and alternatives. We discussed the high likelihood of a successful procedure. We discussed the risks of the procedure, including infection, bleeding, tissue injury, clip migration, and inadequate sampling. Informed written consent was given. The usual time-out protocol was performed immediately prior to the procedure. Site 1: Right breast 7:30 o'clock: Ribbon shaped clip Lesion quadrant: Upper outer quadrant Using sterile technique and 1% Lidocaine as local anesthetic, under direct ultrasound visualization, a 14 gauge spring-loaded device was used to perform biopsy of right breast mass 7:30 o'clock using a lateral approach. At the conclusion of the procedure ribbon shaped tissue marker clip was deployed into the biopsy cavity. Follow up 2 view mammogram was performed and dictated separately. Site 2: Right breast 9 o'clock: Coil shaped clip Lesion quadrant: Upper outer quadrant Using sterile technique and 1% Lidocaine as local anesthetic, under direct ultrasound visualization, a 14 gauge spring-loaded  device was used to perform biopsy of right breast mass 9 o'clock position using a lateral approach. At the conclusion of the procedure coil shaped tissue marker clip was deployed into the biopsy cavity. Follow up 2 view mammogram was performed and dictated separately. IMPRESSION: Ultrasound guided biopsy of right breast masses 7:30 o'clock and 9 o'clock position. No apparent complications. Electronically Signed: By: Lovey Newcomer M.D. On: 12/20/2019 09:15       IMPRESSION/PLAN: 1. Stage IB, [id number] grade 2 ER/PR positive, invasive ductal carcinoma of the right breast. Dr. Lisbeth Renshaw discusses the pathology findings and reviews the nature of right breast disease. The consensus from the breast conference includes proceeding with MRI for extent of disease, and  breast conservation with either generous single lumpectomy versus lumpectomy x2 with  sentinel node biopsy. Dr. Burr Medico anticipates Oncotype Dx score to determine a role for systemic therapy. Provided that chemotherapy is not indicated, the patient's course would then be followed by external radiotherapy to the breast followed by antiestrogen therapy. We  discussed the risks, benefits, short, and long term effects of radiotherapy, and the patient is interested in proceeding. Dr. Lisbeth Renshaw discusses the delivery and logistics of radiotherapy and anticipates a course of 4- 6 1/2 weeks of radiotherapy. We will see her back about 2 weeks after surgery to discuss the simulation process and anticipate we starting radiotherapy about 4-6 weeks after surgery.  2. Possible genetic predisposition to malignancy. The patient is a candidate for genetic testing given her personal and family history. She was offered referral and will meet with genetic counseling today.   In a visit lasting 60 minutes, greater than 50% of the time was spent face to face reviewing her case, as well as in preparation of, discussing, and coordinating the patient's care.  The above documentation  reflects my direct findings during this shared patient visit. Please see the separate note by Dr. Lisbeth Renshaw on this date for the remainder of the patient's plan of care.    Carola Rhine, PAC

## 2019-12-26 NOTE — Progress Notes (Signed)
[place]   [phone] Fax:([phone]) [place] Note   Patient Care Team: Arsenio Katz, NP as PCP - General (Nurse Practitioner) Rockwell Germany, RN as Oncology Nurse Navigator Mauro Kaufmann, RN as Oncology Nurse Navigator Donnie Mesa, MD as Consulting Physician (General Surgery) Truitt Merle, MD as Consulting Physician (Hematology) Kyung Rudd, MD as Consulting Physician (Radiation Oncology)  Date of Service:  12/26/2019   CHIEF COMPLAINTS/PURPOSE OF CONSULTATION:  Newly Diagnosed Malignant neoplasm of upper-outer quadrant of right breast    Oncology History Overview Note  Cancer Staging Malignant neoplasm of upper-outer quadrant of right breast in female, estrogen receptor positive ([place]) Staging form: Breast, AJCC 8th Edition - Clinical stage from 12/26/2019: Stage IB (cT2, cN0, cM0, G2, ER+, PR+, HER2-) - Unsigned    Malignant neoplasm of upper-outer quadrant of right breast in female, estrogen receptor positive ([place])  12/20/2019 Initial Biopsy   Diagnosis 1. Breast, right, needle core biopsy, 7:30 o'clock, 4 cmfn, ribbon clip - FIBROCYSTIC CHANGES. - THERE IS NO EVIDENCE OF MALIGNANCY. - SEE COMMENT. 2. Breast, right, needle core biopsy, 9 o'clock, 5 cmfn, coil clip - INVASIVE MAMMARY CARCINOMA. - SEE COMMENT. 3. Breast, right, needle core biopsy, outer, x clip - INVASIVE MAMMARY CARCINOMA. - ATYPICAL LOBULAR HYPERPLASIA WITH CALCIFICATIONS. - SEE COMMENT. Microscopic Comment 2. The carcinoma appears at least grade 2. The longest span of tumor is 1.2 cm. An E-Cadherin and a breast prognostic profile will be performed on part 2 and the results reported separately. Dr. Jeannie Done has reviewed part 2 and concurs with this interpretation. The results were called to [facility] on 12/21/2019. 3. The carcinoma in part 3 is morphologically dissimilar from that in part 2. The longest span of tumor is 0.1 cm.  An E-Cadherin and a breast prognostic profile will also be performed on part 3 and the results reported separately. (JBK:ah 12/21/19)   12/20/2019 Receptors her2   2. PROGNOSTIC INDICATORS Results: IMMUNOHISTOCHEMICAL AND MORPHOMETRIC ANALYSIS PERFORMED MANUALLY The tumor cells are NEGATIVE for Her2 (1+). Estrogen Receptor: 95%, POSITIVE, STRONG STAINING INTENSITY Progesterone Receptor: 95%, POSITIVE, STRONG STAINING INTENSITY Proliferation Marker Ki67: 15%   3. Prognostic indicators:  HER2 NEGATIVE  ER: 95% POSITIVE STRONG STAINING PR 90% POSITIVE STRONG STAINING  Proliferation Marker Ki67: 20%   12/20/2019 Mammogram   Diagnostic Mammogram  IMPRESSION    12/25/2019 Initial Diagnosis   Malignant neoplasm of upper-outer quadrant of right breast in female, estrogen receptor positive ([place])      HISTORY OF PRESENTING ILLNESS:  Cynthia Shaw 59 y.o. female is a here because of newly diagnosed right breast cancer. The patient presents to the Breast clinic today accompanied by her husband.   Her mass was found by screening mammogram. She did not feel her mass herself. She has not been having yearly mammograms before and it has been many years since her last mammogram. She was having right breast pain since 07/2019 and she went to her PCP who recommended Mammogram. Her intermittent sharp right breast pain has improved. She notes she has dimpling of her right breast since mammogram. She denies nipple changes. She has no new pain. She does have chronic left hip and back pain in the past 2 years. She was treated with steroids and resolved after weight loss. Since recent weight gain her pain returned, it is arthritis related. She notes she has been depressed and anxious lately due to her diagnosis.   Socially is married with  3 biological adult children. She drinks socially and smokes a few cigarettes a day. She is willing to quit. 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  They have a PMHx of anxiety and  depression, early stage cervical cancer in 6777 at 12 years old.  Her sister had stage 1 endometrial cancer at 16. Her second sister had ADH in 2019. Her MGM had colon cancer at 58 years old. She notes she only had mild hot flashes, overall her menopause has been easy for her.    GYN HISTORY  Menarchal: 30 LMP: 59 years old and 6 months.  Contraceptive: 30-25 yrs old (1983-1988) and 45-48 yrs old (2003-2008) HRT: No G3P3: first born at age 65    REVIEW OF SYSTEMS:    Constitutional: Denies fevers, chills or abnormal night sweats Eyes: Denies blurriness of vision, double vision or watery eyes Ears, nose, mouth, throat, and face: Denies mucositis or sore throat Respiratory: Denies cough, dyspnea or wheezes Cardiovascular: Denies palpitation, chest discomfort or lower extremity swelling Gastrointestinal:  Denies nausea, heartburn or change in bowel habits Skin: Denies abnormal skin rashes MSK :(+) Left hip and back pain, arthritis  Lymphatics: Denies new lymphadenopathy or easy bruising Neurological:Denies numbness, tingling or new weaknesses Behavioral/Psych: Mood is stable, no new changes  Breast: (+) Right shooting pain, intermittent and improved (+) Right breast dimpling  All other systems were reviewed with the patient and are negative.   MEDICAL HISTORY:  Past Medical History:  Diagnosis Date  . Anxiety   . Depression     SURGICAL HISTORY: Past Surgical History:  Procedure Laterality Date  . c sections     . CESAREAN SECTION      SOCIAL HISTORY: Social History   Socioeconomic History  . Marital status: Married    Spouse name: Not on file  . Number of children: 3  . Years of education: Not on file  . Highest education level: Not on file  Occupational History  . Not on file  Tobacco Use  . Smoking status: Current Every Day Smoker    Packs/day: 0.50    Years: 8.00    Pack years: 4.00    Types: Cigarettes  . Smokeless tobacco: Never Used  Substance and  Sexual Activity  . Alcohol use: Yes    Comment: socially  . Drug use: Never  . Sexual activity: Not on file  Other Topics Concern  . Not on file  Social History Narrative  . Not on file   Social Determinants of Health   Financial Resource Strain:   . Difficulty of Paying Living Expenses:   Food Insecurity:   . Worried About Charity fundraiser in the Last Year:   . Arboriculturist in the Last Year:   Transportation Needs:   . Film/video editor (Medical):   Marland Kitchen Lack of Transportation (Non-Medical):   Physical Activity:   . Days of Exercise per Week:   . Minutes of Exercise per Session:   Stress:   . Feeling of Stress :   Social Connections:   . Frequency of Communication with Friends and Family:   . Frequency of Social Gatherings with Friends and Family:   . Attends Religious Services:   . Active Member of Clubs or Organizations:   . Attends Archivist Meetings:   Marland Kitchen Marital Status:   Intimate Partner Violence:   . Fear of Current or Ex-Partner:   . Emotionally Abused:   Marland Kitchen Physically Abused:   . Sexually Abused:  FAMILY HISTORY: Family History  Problem Relation Age of Onset  . Uterine cancer Sister 71  . Colon cancer Maternal Grandmother     ALLERGIES:  is allergic to sulfa antibiotics.  MEDICATIONS:  Current Outpatient Medications  Medication Sig Dispense Refill  . acetaminophen (TYLENOL) 500 MG tablet Take 500 mg by mouth every 6 (six) hours as needed.    Marland Kitchen ibuprofen (ADVIL) 800 MG tablet Take by mouth.     No current facility-administered medications for this visit.    PHYSICAL EXAMINATION: ECOG PERFORMANCE STATUS: 0 - Asymptomatic  Vitals:   12/26/19 1249  BP: (!) 142/86  Pulse: 91  Resp: 20  Temp: 98.9 F (37.2 C)  SpO2: 97%   Filed Weights   12/26/19 1249  Weight: 246 lb 9.6 oz (111.9 kg)    GENERAL:alert, no distress and comfortable SKIN: skin color, texture, turgor are normal, no rashes or significant lesions EYES:  normal, Conjunctiva are pink and non-injected, sclera clear  NECK: supple, thyroid normal size, non-tender, without nodularity LYMPH:  no palpable lymphadenopathy in the cervical, axillary  LUNGS: clear to auscultation and percussion with normal breathing effort HEART: regular rate & rhythm and no murmurs and no lower extremity edema ABDOMEN:abdomen soft, non-tender and normal bowel sounds Musculoskeletal:no cyanosis of digits and no clubbing  NEURO: alert & oriented x 3 with fluent speech, no focal motor/sensory deficits BREAST: (+) Moderate skin ecchymosis with mild tenderness at right breast biopsy sites (+) 0.5cm Small lump at 9:30-10:00 position of right breast, likely hematoma. Left Breast exam benign.  LABORATORY DATA:  I have reviewed the data as listed CBC Latest Ref Rng & Units 12/26/2019  WBC 4.0 - 10.5 K/uL 8.0  Hemoglobin 12.0 - 15.0 g/dL 15.1(H)  Hematocrit 36.0 - 46.0 % 44.8  Platelets 150 - 400 K/uL 272    CMP Latest Ref Rng & Units 12/26/2019  Glucose 70 - 99 mg/dL 100(H)  BUN 6 - 20 mg/dL 17  Creatinine 0.44 - 1.00 mg/dL 0.94  Sodium 135 - 145 mmol/L 142  Potassium 3.5 - 5.1 mmol/L 4.2  Chloride 98 - 111 mmol/L 106  CO2 22 - 32 mmol/L 26  Calcium 8.9 - 10.3 mg/dL 10.4(H)  Total Protein 6.5 - 8.1 g/dL 7.6  Total Bilirubin 0.3 - 1.2 mg/dL 0.6  Alkaline Phos 38 - 126 U/L 67  AST 15 - 41 U/L 30  ALT 0 - 44 U/L 53(H)     RADIOGRAPHIC STUDIES: I have personally reviewed the radiological images as listed and agreed with the findings in the report. MM CLIP PLACEMENT RIGHT  Result Date: 12/20/2019 CLINICAL DATA:  Patient status post ultrasound-guided biopsy right breast mass 7:30 o'clock and 9 o'clock position. Stereotactic guided biopsy right breast calcifications. EXAM: DIAGNOSTIC RIGHT MAMMOGRAM POST STEREOTACTIC AND ULTRASOUND BIOPSY COMPARISON:  Previous exam(s). FINDINGS: Site 1: Right breast 7:30 o'clock mass: Ribbon shaped clip: In appropriate position. Site 2:  Right breast 9 o'clock mass: Coil shaped clip: In appropriate position. Site 3: Right breast calcifications: X shaped clip: Migrated approximately 4 cm medial to the site of biopsied calcifications. IMPRESSION: Note the X shaped clip has migrated approximately 4 cm medial to the expected site of biopsied calcifications. The coil shaped and ribbon shaped clips are in appropriate position. Final Assessment: Post Procedure Mammograms for Marker Placement Electronically Signed   By: Lovey Newcomer M.D.   On: 12/20/2019 09:25   MM RT BREAST BX W LOC DEV 1ST LESION IMAGE BX SPEC STEREO GUIDE  Addendum  Date: 12/25/2019   ADDENDUM REPORT: 12/21/2019 16:01 ADDENDUM: Pathology revealed FIBROCYSTIC CHANGES of the RIGHT breast, 7:30 o'clock, 4cmfn, ribbon clip. This was found to be concordant by Dr. Lovey Newcomer. Pathology revealed GRADE II INVASIVE MAMMARY CARCINOMA of the RIGHT breast, 9 o'clock, 5 cmfn, coil clip. This was found to be concordant by Dr. Lovey Newcomer. Pathology revealed INVASIVE MAMMARY CARCINOMA, ATYPICAL LOBULAR HYPERPLASIA WITH CALCIFICATIONS of the RIGHT breast, outer, x clip. This was found to be concordant by Dr. Lovey Newcomer. Pathology results were discussed with the patient by telephone. The patient reported doing well after the biopsies with tenderness at the sites. Post biopsy instructions and care were reviewed and questions were answered. The patient was encouraged to call [facility] for any additional concerns. As requested, the patient was referred to [facility] on December 26, 2019. Pathology results reported by Stacie Acres RN on 12/21/2019. Electronically Signed   By: Lovey Newcomer M.D.   On: 12/21/2019 16:01   Result Date: 12/25/2019 CLINICAL DATA:  Patient with indeterminate right breast calcifications. EXAM: RIGHT BREAST STEREOTACTIC CORE NEEDLE BIOPSY COMPARISON:  Previous exams. FINDINGS: The  patient and I discussed the procedure of stereotactic-guided biopsy including benefits and alternatives. We discussed the high likelihood of a successful procedure. We discussed the risks of the procedure including infection, bleeding, tissue injury, clip migration, and inadequate sampling. Informed written consent was given. The usual time out protocol was performed immediately prior to the procedure. Using sterile technique and 1% Lidocaine as local anesthetic, under stereotactic guidance, a 9 gauge vacuum assisted device was used to perform core needle biopsy of calcifications within the outer right breast using a lateral approach. Specimen radiograph was performed showing calcifications. Specimens with calcifications are identified for pathology. Lesion quadrant: Upper outer quadrant At the conclusion of the procedure, X shaped tissue marker clip was deployed into the biopsy cavity. Follow-up 2-view mammogram was performed and dictated separately. IMPRESSION: Stereotactic-guided biopsy of right breast calcifications. No apparent complications. Electronically Signed: By: Lovey Newcomer M.D. On: 12/20/2019 09:13   [place] RT BREAST BX W LOC DEV 1ST LESION IMG BX SPEC US GUIDE  Addendum Date: 12/25/2019   ADDENDUM REPORT: 12/21/2019 15:59 ADDENDUM: Pathology revealed FIBROCYSTIC CHANGES of the RIGHT breast, 7:30 o'clock, 4cmfn, ribbon clip. This was found to be concordant by Dr. Lovey Newcomer. Pathology revealed GRADE II INVASIVE MAMMARY CARCINOMA of the RIGHT breast, 9 o'clock, 5 cmfn, coil clip. This was found to be concordant by Dr. Lovey Newcomer. Pathology revealed INVASIVE MAMMARY CARCINOMA, ATYPICAL LOBULAR HYPERPLASIA WITH CALCIFICATIONS of the RIGHT breast, outer, x clip. This was found to be concordant by Dr. Lovey Newcomer. Pathology results were discussed with the patient by telephone. The patient reported doing well after the biopsies with tenderness at the sites. Post biopsy instructions and care were reviewed and  questions were answered. The patient was encouraged to call [facility] for any additional concerns. As requested, the patient was referred to [facility] on December 26, 2019. Pathology results reported by Stacie Acres RN on 12/21/2019. Electronically Signed   By: Lovey Newcomer M.D.   On: 12/21/2019 15:59   Result Date: 12/25/2019 CLINICAL DATA:  Patient with indeterminate right breast masses. EXAM: ULTRASOUND GUIDED RIGHT BREAST CORE NEEDLE BIOPSY COMPARISON:  Previous exam(s). PROCEDURE: I met with the patient and we discussed the  procedure of ultrasound-guided biopsy, including benefits and alternatives. We discussed the high likelihood of a successful procedure. We discussed the risks of the procedure, including infection, bleeding, tissue injury, clip migration, and inadequate sampling. Informed written consent was given. The usual time-out protocol was performed immediately prior to the procedure. Site 1: Right breast 7:30 o'clock: Ribbon shaped clip Lesion quadrant: Upper outer quadrant Using sterile technique and 1% Lidocaine as local anesthetic, under direct ultrasound visualization, a 14 gauge spring-loaded device was used to perform biopsy of right breast mass 7:30 o'clock using a lateral approach. At the conclusion of the procedure ribbon shaped tissue marker clip was deployed into the biopsy cavity. Follow up 2 view mammogram was performed and dictated separately. Site 2: Right breast 9 o'clock: Coil shaped clip Lesion quadrant: Upper outer quadrant Using sterile technique and 1% Lidocaine as local anesthetic, under direct ultrasound visualization, a 14 gauge spring-loaded device was used to perform biopsy of right breast mass 9 o'clock position using a lateral approach. At the conclusion of the procedure coil shaped tissue marker clip was deployed into the biopsy cavity. Follow up 2 view mammogram was  performed and dictated separately. IMPRESSION: Ultrasound guided biopsy of right breast masses 7:30 o'clock and 9 o'clock position. No apparent complications. Electronically Signed: By: Lovey Newcomer M.D. On: 12/20/2019 09:15   [place] RT BREAST BX W LOC DEV EA ADD LESION IMG BX SPEC US GUIDE  Addendum Date: 12/25/2019   ADDENDUM REPORT: 12/21/2019 15:59 ADDENDUM: Pathology revealed FIBROCYSTIC CHANGES of the RIGHT breast, 7:30 o'clock, 4cmfn, ribbon clip. This was found to be concordant by Dr. Lovey Newcomer. Pathology revealed GRADE II INVASIVE MAMMARY CARCINOMA of the RIGHT breast, 9 o'clock, 5 cmfn, coil clip. This was found to be concordant by Dr. Lovey Newcomer. Pathology revealed INVASIVE MAMMARY CARCINOMA, ATYPICAL LOBULAR HYPERPLASIA WITH CALCIFICATIONS of the RIGHT breast, outer, x clip. This was found to be concordant by Dr. Lovey Newcomer. Pathology results were discussed with the patient by telephone. The patient reported doing well after the biopsies with tenderness at the sites. Post biopsy instructions and care were reviewed and questions were answered. The patient was encouraged to call [facility] for any additional concerns. As requested, the patient was referred to [facility] at [facility] on December 26, 2019. Pathology results reported by Stacie Acres RN on 12/21/2019. Electronically Signed   By: Lovey Newcomer M.D.   On: 12/21/2019 15:59   Result Date: 12/25/2019 CLINICAL DATA:  Patient with indeterminate right breast masses. EXAM: ULTRASOUND GUIDED RIGHT BREAST CORE NEEDLE BIOPSY COMPARISON:  Previous exam(s). PROCEDURE: I met with the patient and we discussed the procedure of ultrasound-guided biopsy, including benefits and alternatives. We discussed the high likelihood of a successful procedure. We discussed the risks of the procedure, including infection, bleeding, tissue injury, clip migration, and inadequate sampling.  Informed written consent was given. The usual time-out protocol was performed immediately prior to the procedure. Site 1: Right breast 7:30 o'clock: Ribbon shaped clip Lesion quadrant: Upper outer quadrant Using sterile technique and 1% Lidocaine as local anesthetic, under direct ultrasound visualization, a 14 gauge spring-loaded device was used to perform biopsy of right breast mass 7:30 o'clock using a lateral approach. At the conclusion of the procedure ribbon shaped tissue marker clip was deployed into the biopsy cavity. Follow up 2 view mammogram was performed and dictated separately. Site 2: Right breast 9 o'clock: Coil shaped clip Lesion quadrant: Upper  outer quadrant Using sterile technique and 1% Lidocaine as local anesthetic, under direct ultrasound visualization, a 14 gauge spring-loaded device was used to perform biopsy of right breast mass 9 o'clock position using a lateral approach. At the conclusion of the procedure coil shaped tissue marker clip was deployed into the biopsy cavity. Follow up 2 view mammogram was performed and dictated separately. IMPRESSION: Ultrasound guided biopsy of right breast masses 7:30 o'clock and 9 o'clock position. No apparent complications. Electronically Signed: By: Lovey Newcomer M.D. On: 12/20/2019 09:15    ASSESSMENT & PLAN:  Soriah Leeman is a 59 y.o. Caucasian female with a history of Anxiety and depression, H/o of Cervical cancer    1. Malignant neoplasm of upper-outer quadrant of right breast, Stage IB, c(T2N0M0), ER/PR+/HER2-, Grade II -We discussed her image findings and the biopsy results in great details. 2.6cm mass at 9:00 position of right breast and another small mass adjacent, both with invasive lobular carcinoma.  -She will have breast MRI given her lobular disease. She is agreeable.  We discussed that she may need additional biopsy if MRI shows new lesions -Given the early stage disease, she likely need a lumpectomy and sentinel LN biopsy. She is  agreeable with that. She was seen by Dr. Georgette Dover today and likely will proceed with surgery soon.  -I recommend a Oncotype Dx test on the surgical sample and we'll make a decision about adjuvant chemotherapy based on the Oncotype result. Written material of this test was given to her. She is young and fit, would be a good candidate for chemotherapy if her Oncotype recurrence score is high. -If her surgical sentinel lymph node positive, I recommend mammaprint for further risk stratification and guide adjuvant chemotherapy. -The risk of recurrence depends on the stage and biology of the tumor. She is early stage, with ER/PR positive and HER2 negative markers. I discussed this is likely low risk disease -She was also seen by radiation oncologist Dr. Lisbeth Renshaw today. If her surgical sentinel lymph nodes were negative, she would not need post mastectomy radiation. Otherwise radiation is recommended to reduce the risk for local recurrence.  -Given the strong ER and PR expression in her postmenopausal status, I recommend adjuvant endocrine therapy with aromatase inhibitor Letrozole or Anastrozole for a total of 7-10 years to reduce the risk of cancer recurrence. Potential benefits and side effects were discussed with patient and she is interested. -We also discussed the breast cancer surveillance after her surgery. She will continue annual screening mammogram, self exam, and a routine office visit with lab and exam with [place]. -I encouraged her to have healthy diet and exercise regularly -Physical exam skin ecchymosis and likely hematoma due to biopsy. Labs reviewed, CBC and CMP WNL except Hg 15.1, MCV 103, BG 100, CA 10.4, ALT 53. I discussed given mild elevations, it if fine to monitor alone.  -Will f/u after surgery or Radiation.    2. H/o Cervical Cancer, On situ carcinoma, Genetic Testing -Dx 1990 and treated at [facility] in [place], [place]. Pap smears have been benign since.  -She has family history of  Endometrial cancer, ADH, and colon cancer. Given her strong family and personal history of cancer, she is eligible for genetic testing. She is agreeable, will send referral.     3. Anxiety/Depression  -She notes she has been anxious and depressed about her recent cancer diagnosis.  -I offered her to chance to speak with her SW for counseling. She declined for now.  -She was on Lexapro  before which caused weight gain. She would like to manage without medication, her mood was stable prior to cancer diagnosis.    4. Arthritis, Weight gain -She has chronic left hip and back pain since 2019. This resolved after shots to area and weight loss.  -She did gain weight during COVID in 2020 and has pain has recurred in the past 6 months.    5. Smoking Cessation  -She does smoke less currently, a few cigarettes a day. I discussed complete smoking cessation as this can effect how she heals from surgery.  -She is willing to quit but notes it is hard with recent anxiety and depression. She declined antidepressants for now.    PLAN:  -Send genetic referral.  -MRI breast in 1-2 weeks  -Will proceed with surgery soon, Oncotype on her surgical sample  -F/u after surgery or RT   No orders of the defined types were placed in this encounter.   All questions were answered. The patient knows to call the clinic with any problems, questions or concerns. The total time spent in the appointment was 60 minutes.     Truitt Merle, MD 12/26/2019 11:28 PM  I, Joslyn Devon, am acting as scribe for Truitt Merle, MD.   I have reviewed the above documentation for accuracy and completeness, and I agree with the above.

## 2019-12-27 ENCOUNTER — Encounter: Payer: Self-pay | Admitting: Genetic Counselor

## 2019-12-27 ENCOUNTER — Encounter: Payer: Self-pay | Admitting: Hematology

## 2019-12-27 DIAGNOSIS — Z8049 Family history of malignant neoplasm of other genital organs: Secondary | ICD-10-CM | POA: Insufficient documentation

## 2019-12-27 DIAGNOSIS — Z808 Family history of malignant neoplasm of other organs or systems: Secondary | ICD-10-CM | POA: Insufficient documentation

## 2019-12-27 DIAGNOSIS — Z800 Family history of malignant neoplasm of digestive organs: Secondary | ICD-10-CM | POA: Insufficient documentation

## 2019-12-27 NOTE — Progress Notes (Signed)
REFERRING PROVIDER: Truitt Merle, MD [place],  [place] [address]  PRIMARY PROVIDER:  Arsenio Katz, NP  PRIMARY REASON FOR VISIT:  1. Malignant neoplasm of upper-outer quadrant of right breast in female, estrogen receptor positive ([place])   2. Family history of uterine cancer   3. Family history of colon cancer   4. Family history of melanoma   5. Family history of throat cancer      I connected with Ms. Scalici on 12/26/2019 at 3:45 pm EDT by video conference and verified that I am speaking with the correct person using two identifiers.   Patient location: [facility] clinic Provider location: [facility] office  HISTORY OF PRESENT ILLNESS:   Ms. Nourse, a 59 y.o. female, was seen for a [facility] cancer genetics consultation at the request of Dr. Burr Medico due to a personal and family history of cancer.  Ms. Fosco presents to clinic today to discuss the possibility of a hereditary predisposition to cancer, genetic testing, and to further clarify her future cancer risks, as well as potential cancer risks for family members.   In May 2021, at the age of 46, Ms. Balzarini was diagnosed with invasive lobular cancer, ER+/PR+/Her2-, of the right breast. The treatment plan includes MRI, surgery, radiation therapy, and antiestrogen therapy.Marland Kitchen      CANCER HISTORY:  Oncology History Overview Note  Cancer Staging Malignant neoplasm of upper-outer quadrant of right breast in female, estrogen receptor positive ([place]) Staging form: Breast, AJCC 8th Edition - Clinical stage from 12/26/2019: Stage IB (cT2, cN0, cM0, G2, ER+, PR+, HER2-) - Unsigned    Malignant neoplasm of upper-outer quadrant of right breast in female, estrogen receptor positive ([place])  12/20/2019 Initial Biopsy   Diagnosis 1. Breast, right, needle core biopsy, 7:30 o'clock, 4 cmfn, ribbon clip - FIBROCYSTIC CHANGES. - THERE IS NO EVIDENCE OF MALIGNANCY. - SEE COMMENT. 2. Breast, right, needle core biopsy, 9 o'clock, 5 cmfn, coil  clip - INVASIVE MAMMARY CARCINOMA. - SEE COMMENT. 3. Breast, right, needle core biopsy, outer, x clip - INVASIVE MAMMARY CARCINOMA. - ATYPICAL LOBULAR HYPERPLASIA WITH CALCIFICATIONS. - SEE COMMENT. Microscopic Comment 2. The carcinoma appears at least grade 2. The longest span of tumor is 1.2 cm. An E-Cadherin and a breast prognostic profile will be performed on part 2 and the results reported separately. Dr. Jeannie Done has reviewed part 2 and concurs with this interpretation. The results were called to [facility] on 12/21/2019. 3. The carcinoma in part 3 is morphologically dissimilar from that in part 2. The longest span of tumor is 0.1 cm. An E-Cadherin and a breast prognostic profile will also be performed on part 3 and the results reported separately. (JBK:ah 12/21/19)   12/20/2019 Receptors her2   2. PROGNOSTIC INDICATORS Results: IMMUNOHISTOCHEMICAL AND MORPHOMETRIC ANALYSIS PERFORMED MANUALLY The tumor cells are NEGATIVE for Her2 (1+). Estrogen Receptor: 95%, POSITIVE, STRONG STAINING INTENSITY Progesterone Receptor: 95%, POSITIVE, STRONG STAINING INTENSITY Proliferation Marker Ki67: 15%   3. Prognostic indicators:  HER2 NEGATIVE  ER: 95% POSITIVE STRONG STAINING PR 90% POSITIVE STRONG STAINING  Proliferation Marker Ki67: 20%   12/20/2019 Mammogram   Diagnostic Mammogram  IMPRESSION    12/25/2019 Initial Diagnosis   Malignant neoplasm of upper-outer quadrant of right breast in female, estrogen receptor positive ([place])      RISK FACTORS:  Menarche was at age 22.  First live birth at age 73.  OCP use for approximately 13 years.  Ovaries intact: yes.  Hysterectomy: no.  Menopausal  status: postmenopausal.  HRT use: 0 years. Colonoscopy: never. Mammogram within the last year: yes. Up to date with pelvic exams: yes. Any excessive radiation exposure in the past: no  Past Medical History:  Diagnosis Date  . Anxiety   . Depression   . Family  history of colon cancer   . Family history of melanoma   . Family history of throat cancer   . Family history of uterine cancer     Past Surgical History:  Procedure Laterality Date  . c sections     . CESAREAN SECTION      Social History   Socioeconomic History  . Marital status: Married    Spouse name: Not on file  . Number of children: 3  . Years of education: Not on file  . Highest education level: Not on file  Occupational History  . Not on file  Tobacco Use  . Smoking status: Current Every Day Smoker    Packs/day: 0.50    Years: 8.00    Pack years: 4.00    Types: Cigarettes  . Smokeless tobacco: Never Used  Substance and Sexual Activity  . Alcohol use: Yes    Comment: socially  . Drug use: Never  . Sexual activity: Not on file  Other Topics Concern  . Not on file  Social History Narrative  . Not on file   Social Determinants of Health   Financial Resource Strain:   . Difficulty of Paying Living Expenses:   Food Insecurity:   . Worried About Charity fundraiser in the Last Year:   . Arboriculturist in the Last Year:   Transportation Needs:   . Film/video editor (Medical):   Marland Kitchen Lack of Transportation (Non-Medical):   Physical Activity:   . Days of Exercise per Week:   . Minutes of Exercise per Session:   Stress:   . Feeling of Stress :   Social Connections:   . Frequency of Communication with Friends and Family:   . Frequency of Social Gatherings with Friends and Family:   . Attends Religious Services:   . Active Member of Clubs or Organizations:   . Attends Archivist Meetings:   Marland Kitchen Marital Status:      FAMILY HISTORY:  We obtained a detailed, 4-generation family history.  Significant diagnoses are listed below: Family History  Problem Relation Age of Onset  . Melanoma Mother 84  . Uterine cancer Sister 85  . Colon cancer Maternal Grandmother 66  . Other Sister 67       precancerous cells of the breast  . Throat cancer Paternal  Great-grandfather        dx. in his 14s   Ms. Wardell has two sons (ages 32 and 60) and one daughter (age 51). She has two sisters (ages 28 and 40) and one brother (age 47). Her older sister has a history of uterine cancer diagnosed when she was 56, which was treated with a hysterectomy. Her younger sister has a history of pre-cancerous cells of the breast and is taking tamoxifen.   Ms. Blumberg's mother is 5 and has a history of melanoma diagnosed when she was 36. Ms. Drawdy has no maternal aunts or uncles (her mother was an only child). Her maternal grandmother died at the age of 17 from colon cancer, and her maternal grandfather died when he was 38 from heart problems caused by diabetes. There are no other known diagnoses of cancer on the maternal side  of the family.   Ms. Vaughan's father died at the age of 28 and did not have cancer. She has no paternal aunts or uncles (her father was also an only child). Her paternal grandmother died at the age of 30, and her paternal grandfather died at the age of 22. Neither had cancer. Her great-grandfather (grandfather's father) had throat cancer in his 35s. There are no other known diagnoses of cancer on the paternal side of the family.  Ms. Mangini is unaware of previous family history of genetic testing for hereditary cancer risks. Patient's maternal ancestors are of [place] and [place] descent, and paternal ancestors are of [place] and [place] descent. There is no reported Ashkenazi Jewish ancestry. There is no known consanguinity.  GENETIC COUNSELING ASSESSMENT: Ms. Kleinsasser is a 59 y.o. female with a personal history of breast cancer and a family history of young-onset uterine cancer and colon cancer, which is somewhat suggestive of a hereditary cancer syndrome and predisposition to cancer. We, therefore, discussed and recommended the following at today's visit.   DISCUSSION: We discussed that 5-10% of cancer is hereditary. Most cases of hereditary breast  cancer are associated with the BRCA genes, although there are other genes that can be associated with hereditary breast cancer syndromes. We also discussed that most cases of hereditary uterine and colon cancer are associated with a genetic condition called Lynch syndrome. We discussed that testing is beneficial for several reasons, including knowing about other cancer risks, identifying potential screening and risk-reduction options that may be appropriate, and to understand if other family members could be at risk for cancer and allow them to undergo genetic testing.  We reviewed the characteristics, features and inheritance patterns of hereditary cancer syndromes. We also discussed genetic testing, including the appropriate family members to test, the process of testing, insurance coverage and turn-around-time for results. We discussed the implications of a negative, positive and/or variant of uncertain significant result. In order to get genetic test results in a timely manner so that Ms. Zangara can use these genetic test results for surgical decisions, we recommended Ms. Punch pursue genetic testing for the [facility] Breast Cancer STAT panel. Once complete, we recommend Ms. Gerke pursue reflex genetic testing to the Common Hereditary Cancers panel.   The Breast Cancer STAT Panel offered by [facility] includes sequencing and deletion/duplication analysis for the following 9 genes:  ATM, BRCA1, BRCA2, CDH1, CHEK2, PALB2, PTEN, STK11 and TP53. The Common Hereditary Cancers Panel offered by [facility] includes sequencing and/or deletion duplication testing of the following 48 genes: APC, ATM, AXIN2, BARD1, BMPR1A, BRCA1, BRCA2, BRIP1, CDH1, CDK4, CDKN2A (p14ARF), CDKN2A (p16INK4a), CHEK2, CTNNA1, DICER1, EPCAM (Deletion/duplication testing only), GREM1 (promoter region deletion/duplication testing only), KIT, MEN1, MLH1, MSH2, MSH3, MSH6, MUTYH, NBN, NF1, NHTL1, PALB2, PDGFRA, PMS2, POLD1, POLE, PTEN, RAD50,  RAD51C, RAD51D, RNF43, SDHB, SDHC, SDHD, SMAD4, SMARCA4. STK11, TP53, TSC1, TSC2, and VHL.  The following genes are evaluated for sequence changes only: SDHA and HOXB13 c.251G>A variant only.  Based on Ms. Asfour's family history of cancer, she meets medical criteria for genetic testing. Despite that she meets criteria, she may still have an out of pocket cost.   PLAN: After considering the risks, benefits, and limitations, Ms. Ashford provided informed consent to pursue genetic testing and the blood sample was sent to [facility] for analysis of the Breast Cancer STAT panel + Common Hereditary Cancers panel. Results should be available within approximately one-two weeks' time, at which point they will be disclosed by telephone to  Ms. Duce, as will any additional recommendations warranted by these results. Ms. Burbridge will receive a summary of her genetic counseling visit and a copy of her results once available. This information will also be available in Epic.   Ms. Rippetoe's questions were answered to her satisfaction today. Our contact information was provided should additional questions or concerns arise. Thank you for the referral and allowing [place] to share in the care of your patient.   Clint Guy, [place], [facility] Licensed, Certified Dispensing [email or web address] Phone: [phone]  The patient was seen for a total of 20 minutes in face-to-face genetic counseling.  This patient was discussed with Drs. Magrinat, Lindi Adie and/or Burr Medico who agrees with the above.    _______________________________________________________________________ For Office Staff:  Number of people involved in session: 1 Was an Intern/ student involved with case: no

## 2019-12-27 NOTE — Progress Notes (Signed)
Called patient to inform her of new appointment for upcoming MRI, patient advised she will be there

## 2019-12-28 ENCOUNTER — Other Ambulatory Visit: Payer: Self-pay | Admitting: Surgery

## 2019-12-28 DIAGNOSIS — C50911 Malignant neoplasm of unspecified site of right female breast: Secondary | ICD-10-CM

## 2019-12-29 ENCOUNTER — Ambulatory Visit (HOSPITAL_COMMUNITY)
Admission: RE | Admit: 2019-12-29 | Discharge: 2019-12-29 | Disposition: A | Discharge status: Home / Self Care | Payer: BC Managed Care – PPO | Source: Ambulatory Visit | Attending: Surgery | Admitting: Surgery

## 2019-12-29 ENCOUNTER — Other Ambulatory Visit: Payer: Self-pay

## 2019-12-29 DIAGNOSIS — C50411 Malignant neoplasm of upper-outer quadrant of right female breast: Secondary | ICD-10-CM

## 2019-12-29 DIAGNOSIS — Z170 Estrogen receptor positive status [ER+]: Secondary | ICD-10-CM | POA: Insufficient documentation

## 2019-12-29 IMAGING — MR MR BREAST BILAT WO/W CM
6 of 9 series · 28 of 48 positions shown · IV contrast (gadavist)
Comparison: Previous exam(s).

CLINICAL DATA: 59-year-old female with newly diagnosed right breast
cancer.

LABS:  None performed on site.
EXAM:
BILATERAL BREAST MRI WITH AND WITHOUT CONTRAST
TECHNIQUE: Multiplanar, multisequence MR images of both breasts were obtained
prior to and following the intravenous administration of 10 ml of
Gadavist.

[Series 2: T2 · axial · 3.0mm · 0.96mm/px · z∈[-73,+104]mm · 3 of 45 slices shown]
[im 1/45]
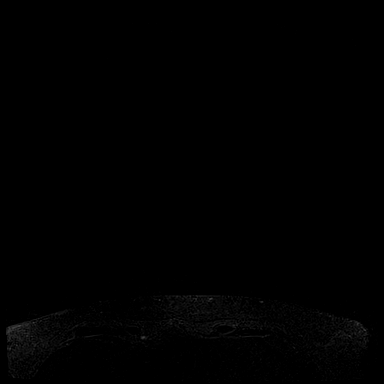
[im 23/45]
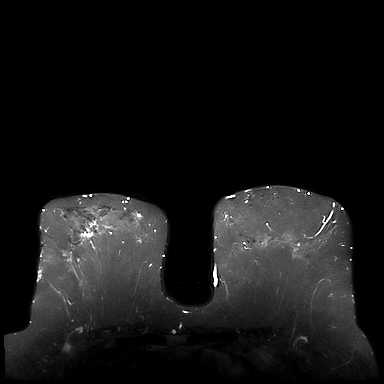
[im 45/45]
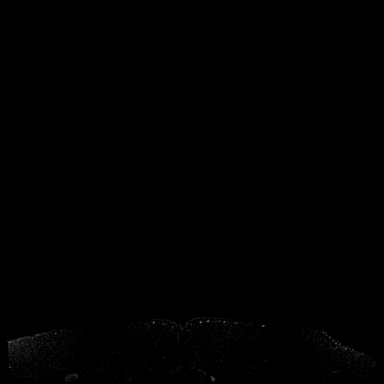

[Series 3: T1 fat-sat · axial · 1.2mm · 0.83mm/px · z∈[-80,+111]mm · 7 of 154 slices shown (1 of 4)]
[im 1/154]
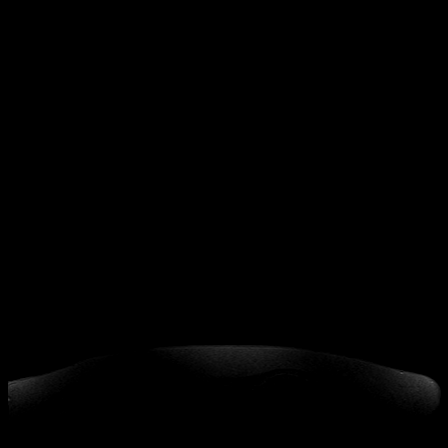
[im 26/154]
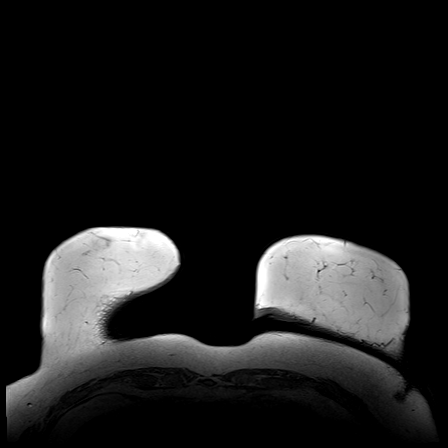
[im 52/154]
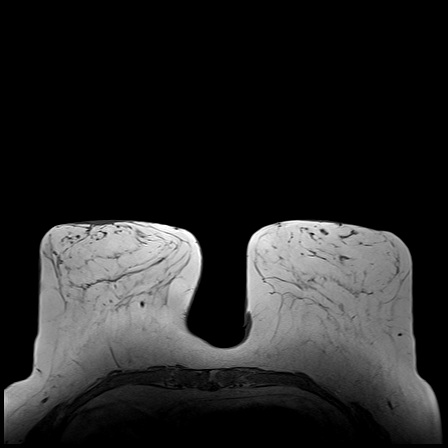
[im 77/154]
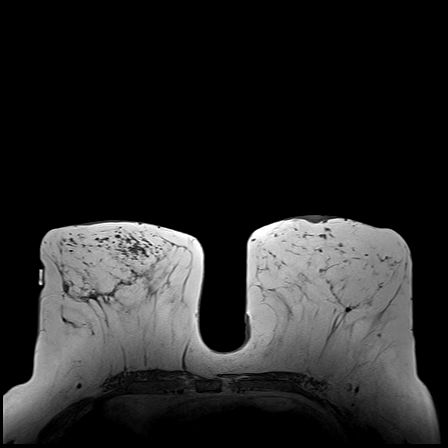
[im 103/154]
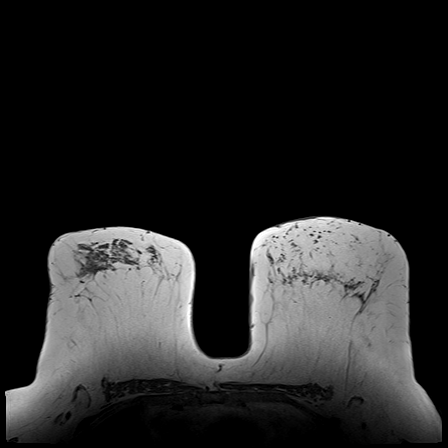
[im 128/154]
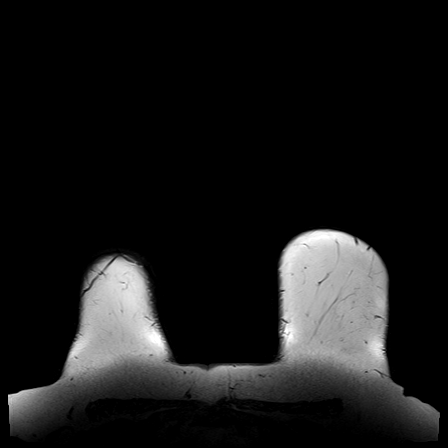
[im 154/154]
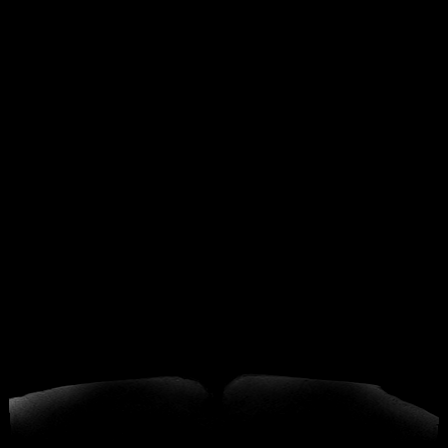

[Series 5: T1 fat-sat · axial · 1.6mm · 0.89mm/px · z∈[-77,+114]mm · 5 of 112 slices shown (2 of 4)]
[im 1/112]
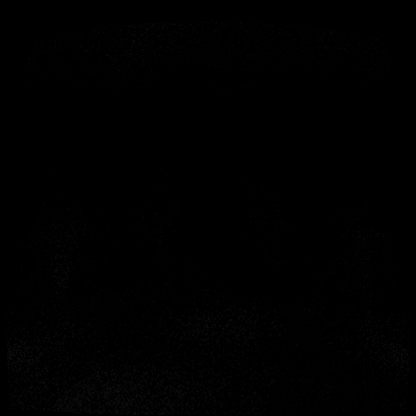
[im 28/112]
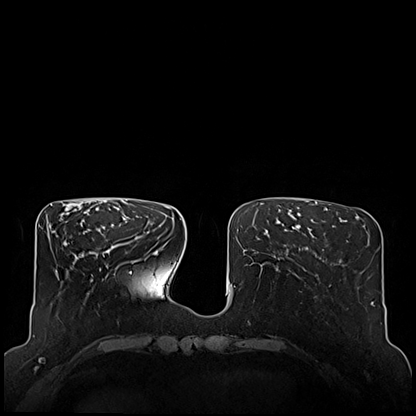
[im 56/112]
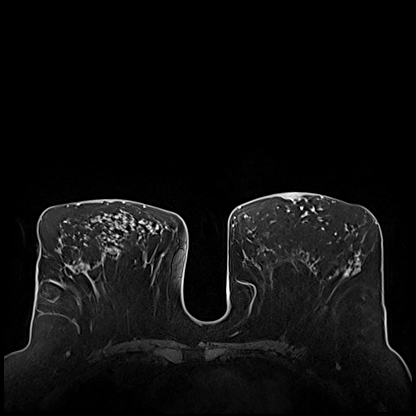
[im 84/112]
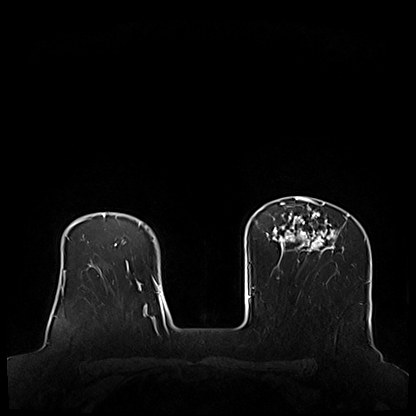
[im 112/112]
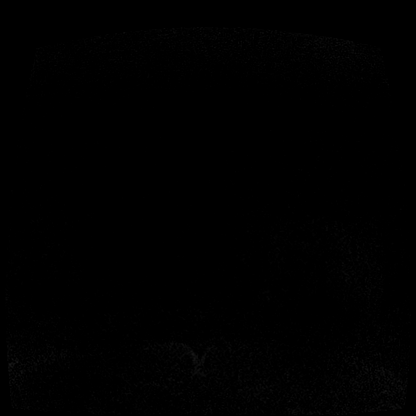

[Series 6: T1 fat-sat · axial · 1.6mm · 0.89mm/px · z∈[-77,+114]mm · 5 of 109 slices shown (3 of 4)]
[im 1/109]
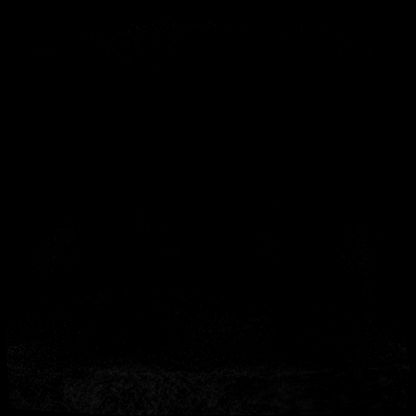
[im 28/109]
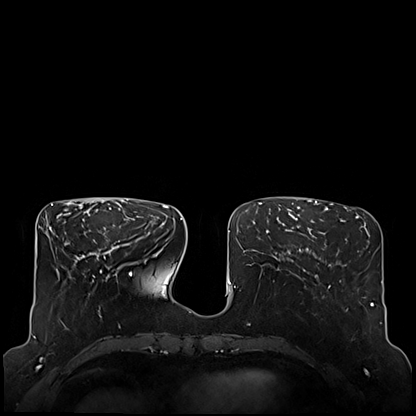
[im 55/109]
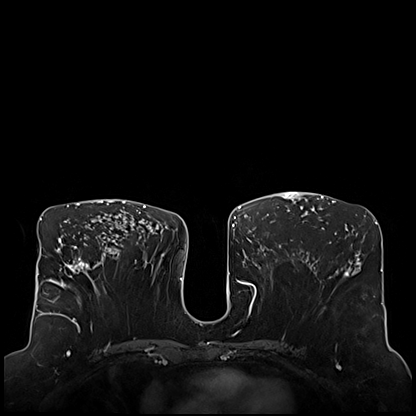
[im 82/109]
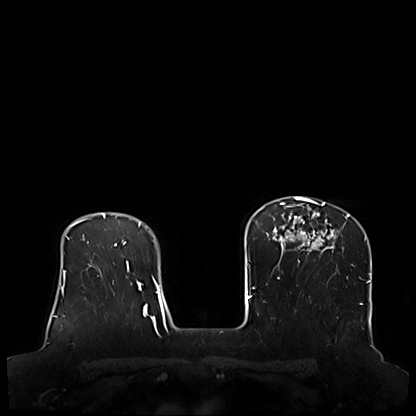
[im 109/109]
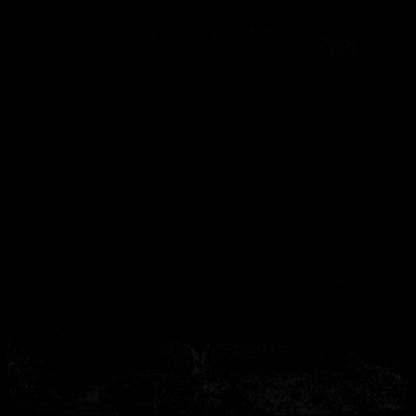

[Series 7: T1 · axial · 1.6mm · 0.89mm/px · z∈[-77,+114]mm · 6 of 120 slices shown]
[im 1/120]
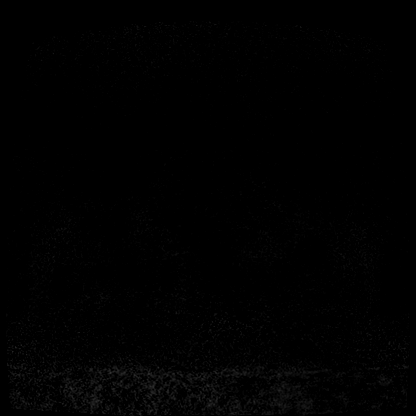
[im 24/120]
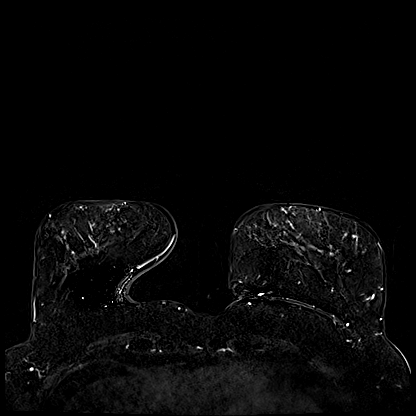
[im 48/120]
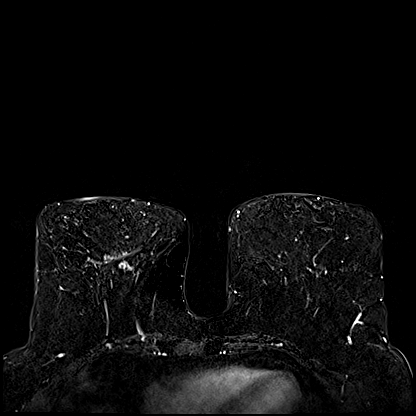
[im 72/120]
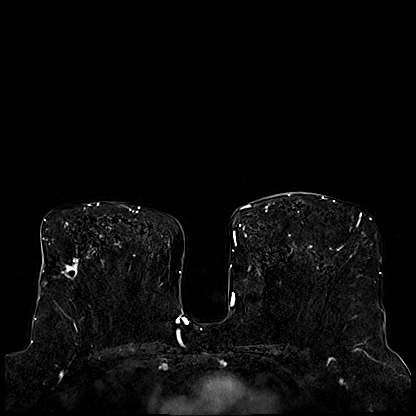
[im 96/120]
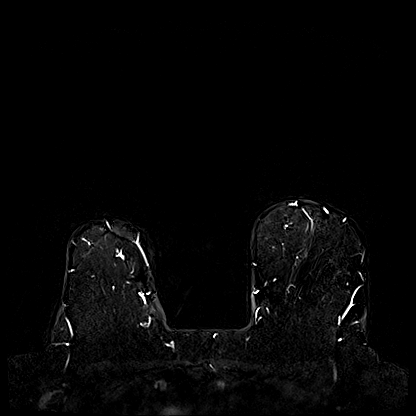
[im 120/120]
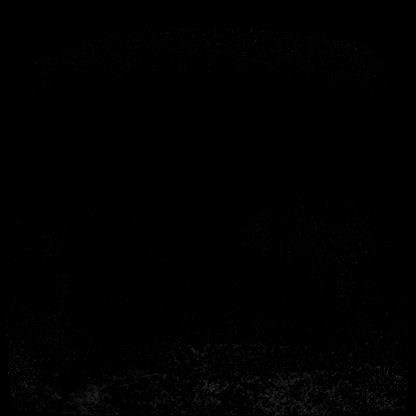

[Series 10: T1 fat-sat · axial · 1.6mm · 0.89mm/px · z∈[-77,-27]mm · 2 of 110 slices shown (4 of 4)]
[im 1/110]
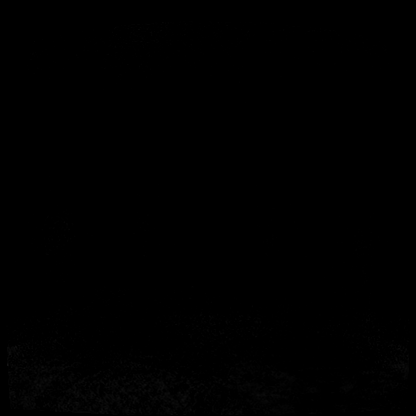
[im 28/110]
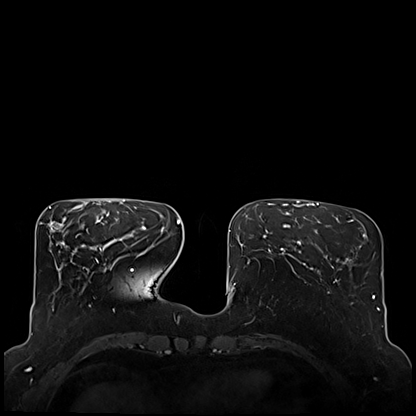

[28 of 48 positions shown; findings below may reference images not displayed]

Three-dimensional MR images were rendered by post-processing of the
original MR data on an independent workstation. The
three-dimensional MR images were interpreted, and findings are
reported in the following complete MRI report for this study. Three
dimensional images were evaluated at the independent [employer]
workstation
FINDINGS: Breast composition: b. Scattered fibroglandular tissue.

Background parenchymal enhancement: Moderate.

Right breast: Susceptibility artifact and post biopsy changes are
demonstrated throughout the lateral right breast. An irregular,
spiculated enhancing masses demonstrated in the upper outer quadrant
at middle depth (series 11, image 54/120). This measures 2.5 x 1.5 x
1.5 cm and is consistent with the patient's index lesion at the site
of the coil shaped clip. A second, slightly irregular mass is seen
just inferior to this (series 11, image 64/120). It measures 8 x 9 x
8 mm and is located approximately 3 cm inferior to the index lesion.
This is seen adjacent to a post biopsy tract in the central right
breast at the site of X shaped clip.

No additional suspicious findings are identified in the remainder of
the right breast.

Left breast: No suspicious mass or abnormal enhancement.

Lymph nodes: No abnormal appearing lymph nodes.

Ancillary findings:  None.
IMPRESSION: 1. 2.5 cm spiculated mass in the upper outer right breast at middle
depth consistent with the patient's biopsy-proven malignancy (coil
clip).
2. Additional 9 mm enhancing mass located approximately 3 cm
inferior to the index lesion. This is seen adjacent to a biopsy
tract in the central right breast and may represents the additional
biopsied focus of malignancy. The associated X-shaped clip was
displaced 4 cm medially at the time of biopsy.
3. No MRI evidence of malignancy on the left.
4. No suspicious lymphadenopathy.

RECOMMENDATION:
Recommendation is for MRI guided biopsy/clip placement of the 9 mm
enhancing right breast mass, if this will alter surgical management.
This mass is located 3 cm inferior to the index lesion and may
represent the additional biopsied site of malignancy for which the
X-shaped clip was displaced.

BI-RADS CATEGORY  6: Known biopsy-proven malignancy.

## 2019-12-29 MED ORDER — GADOBUTROL 1 MMOL/ML IV SOLN
10.0000 mL | Freq: Once | INTRAVENOUS | Status: AC | PRN
  Administered 2019-12-29: 10 mL via INTRAVENOUS

## 2020-01-01 ENCOUNTER — Encounter: Payer: Self-pay | Admitting: General Practice

## 2020-01-01 ENCOUNTER — Other Ambulatory Visit: Payer: Self-pay | Admitting: Surgery

## 2020-01-01 DIAGNOSIS — Z170 Estrogen receptor positive status [ER+]: Secondary | ICD-10-CM

## 2020-01-01 NOTE — Progress Notes (Signed)
[facility] Spiritual Care Note  Phoned Ms Spurlock for follow-up support from [facility]. Left voicemail encouraging callback.   [place], [place], [facility] Pager [phone] Voicemail [phone]

## 2020-01-03 ENCOUNTER — Telehealth: Payer: Self-pay | Admitting: *Deleted

## 2020-01-03 ENCOUNTER — Encounter: Payer: Self-pay | Admitting: *Deleted

## 2020-01-03 NOTE — Telephone Encounter (Signed)
Spoke to pt concerning Cynthia Shaw from 6.2.21. Denies questions or concerns regarding dz or treatment care plan.

## 2020-01-07 ENCOUNTER — Ambulatory Visit (HOSPITAL_COMMUNITY): Payer: BC Managed Care – PPO

## 2020-01-08 ENCOUNTER — Encounter: Payer: Self-pay | Admitting: Genetic Counselor

## 2020-01-08 ENCOUNTER — Telehealth: Payer: Self-pay | Admitting: Genetic Counselor

## 2020-01-08 DIAGNOSIS — Z1379 Encounter for other screening for genetic and chromosomal anomalies: Secondary | ICD-10-CM | POA: Insufficient documentation

## 2020-01-08 NOTE — Telephone Encounter (Signed)
Revealed negative genetic testing on the [facility] Breast Cancer STAT panel. Additional genetic testing is pending for the Common Hereditary Cancers panel. We will reach out to her again once we receive those results to discuss them in more detail.

## 2020-01-10 ENCOUNTER — Encounter: Payer: Self-pay | Admitting: *Deleted

## 2020-01-10 ENCOUNTER — Other Ambulatory Visit: Payer: BC Managed Care – PPO

## 2020-01-11 ENCOUNTER — Telehealth: Payer: Self-pay | Admitting: Genetic Counselor

## 2020-01-11 NOTE — Telephone Encounter (Signed)
LVM that the results for her additional genetic testing are available and requested that she call back to discuss them.

## 2020-01-16 ENCOUNTER — Other Ambulatory Visit: Payer: Self-pay | Admitting: Diagnostic Radiology

## 2020-01-16 ENCOUNTER — Encounter (HOSPITAL_BASED_OUTPATIENT_CLINIC_OR_DEPARTMENT_OTHER): Payer: Self-pay | Admitting: Surgery

## 2020-01-16 ENCOUNTER — Ambulatory Visit
Admission: RE | Admit: 2020-01-16 | Discharge: 2020-01-16 | Disposition: A | Discharge status: Home / Self Care | Payer: BC Managed Care – PPO | Source: Ambulatory Visit | Attending: Surgery | Admitting: Surgery

## 2020-01-16 ENCOUNTER — Other Ambulatory Visit: Payer: Self-pay

## 2020-01-16 ENCOUNTER — Other Ambulatory Visit: Payer: Self-pay | Admitting: Neurology

## 2020-01-16 DIAGNOSIS — Z170 Estrogen receptor positive status [ER+]: Secondary | ICD-10-CM

## 2020-01-16 DIAGNOSIS — C50411 Malignant neoplasm of upper-outer quadrant of right female breast: Secondary | ICD-10-CM

## 2020-01-16 IMAGING — MR MR BREAST BX W/ LOC DEV 1ST LEASION IMAGE BX SPEC MR GUIDE*R*
6 of 8 series · 33 of 48 positions shown · IV contrast (10 ml gadavist)
Comparison: Previous exams.
COMPARISON: Previous exams.

Addendum:
CLINICAL DATA: Patient presents for MRI guided core needle biopsy
of a 9 mm indeterminate enhancing mass over the outer midportion of
the middle to posterior third of the right breast just below
patient's biopsy-proven malignancy. Patient has 2 recent
biopsy-proven sites of malignancy in the right upper outer quadrant.

EXAM:
MRI GUIDED CORE NEEDLE BIOPSY OF THE RIGHT BREAST
TECHNIQUE: Multiplanar, multisequence MR imaging of the right breast was
performed both before and after administration of intravenous
contrast.
CONTRAST:  10mL GADAVIST GADOBUTROL 1 MMOL/ML IV SOLN

[Series 3: fiducial unilateral · sagittal · 2.0mm · 1.33mm/px · 4 of 60 slices shown]
[im 1/60]
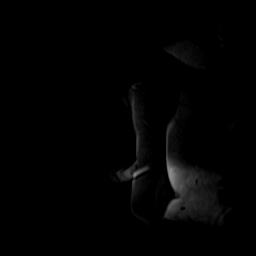
[im 20/60]
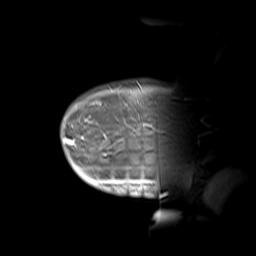
[im 40/60]
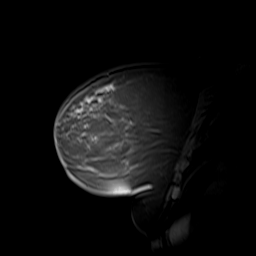
[im 60/60]
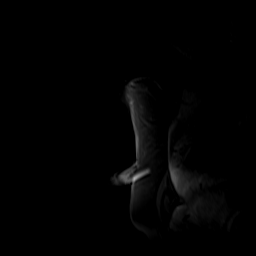

[Series 4: dynamic pre · axial · non-contrast · 1.3mm · 0.73mm/px · z∈[-50,+136]mm · 7 of 144 slices shown]
[im 1/144]
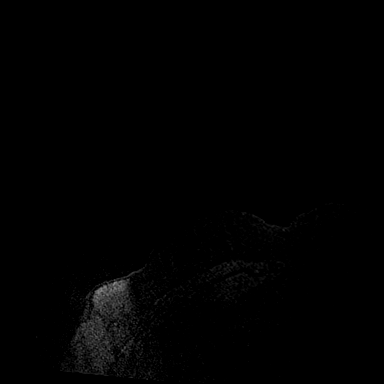
[im 24/144]
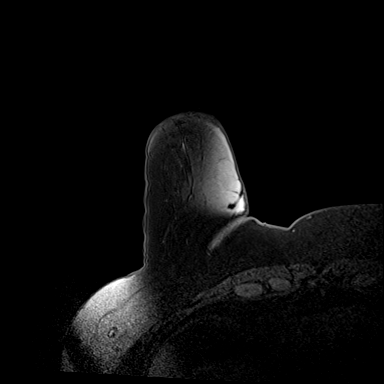
[im 48/144]
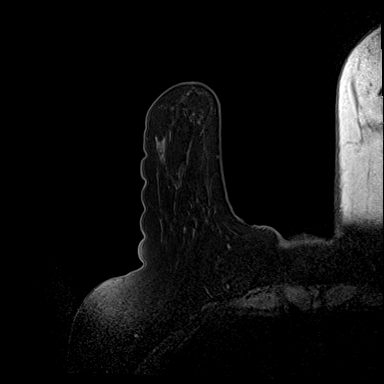
[im 72/144]
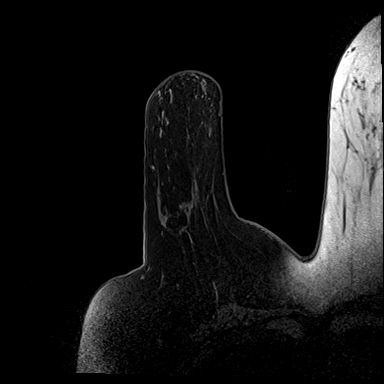
[im 96/144]
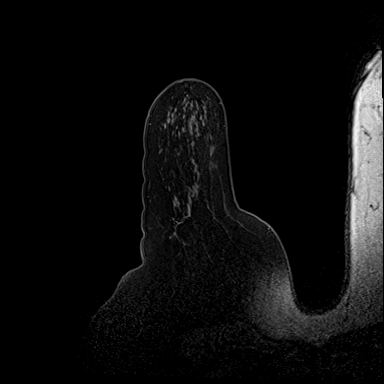
[im 120/144]
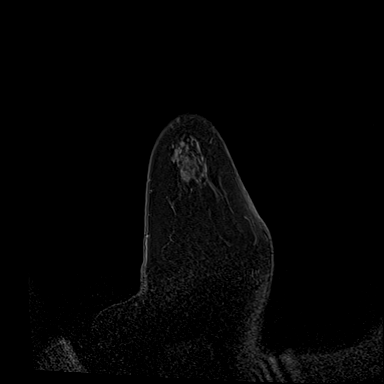
[im 144/144]
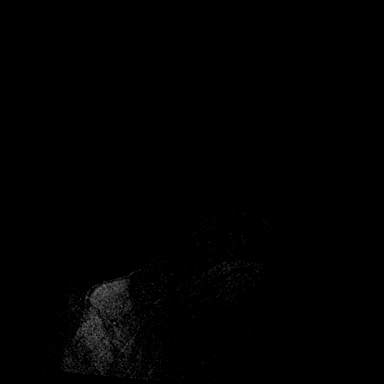

[Series 5: dynamic post 20 · axial · 1.3mm · 0.73mm/px · z∈[-50,+136]mm · 7 of 144 slices shown (1 of 2)]
[im 1/144]
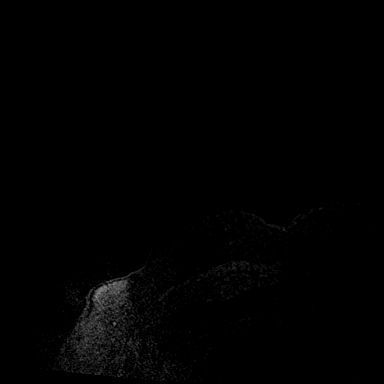
[im 24/144]
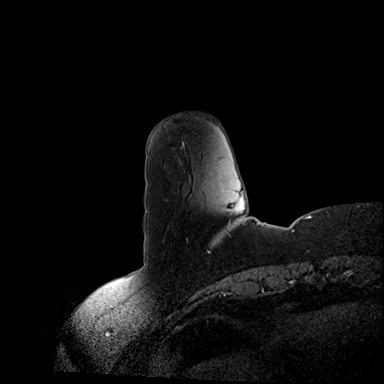
[im 48/144]
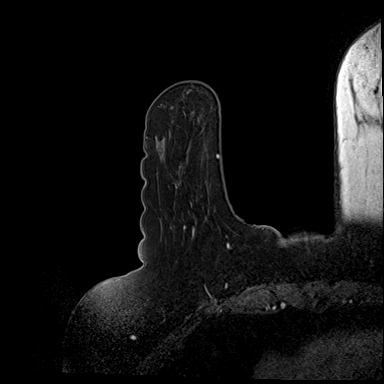
[im 72/144]
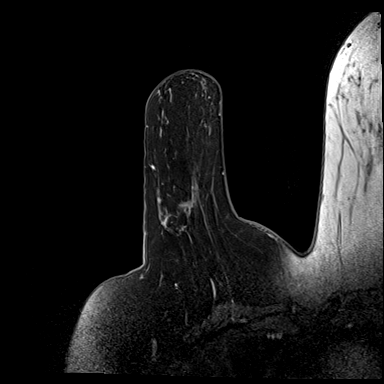
[im 96/144]
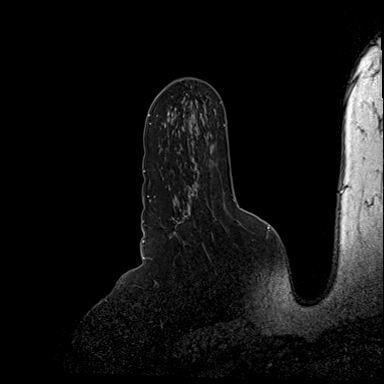
[im 120/144]
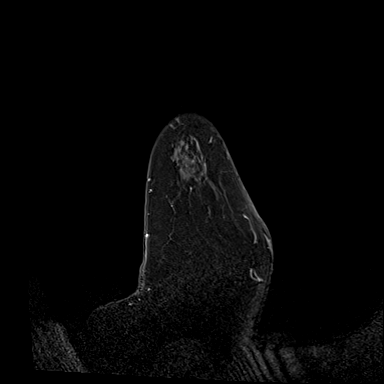
[im 144/144]
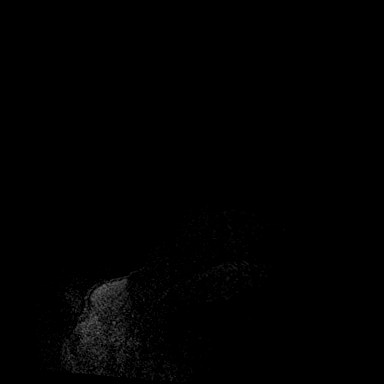

[Series 6: dynamic post 20 · axial · 1.3mm · 0.73mm/px · z∈[-50,+136]mm · 6 of 144 slices shown (2 of 2)]
[im 1/144]
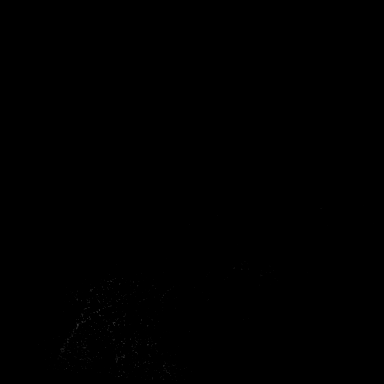
[im 29/144]
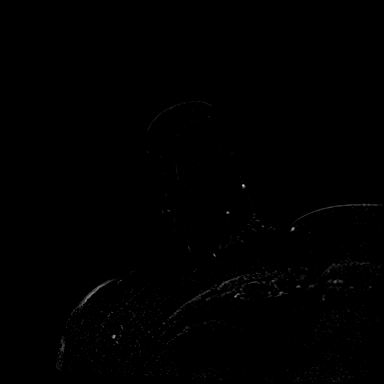
[im 58/144]
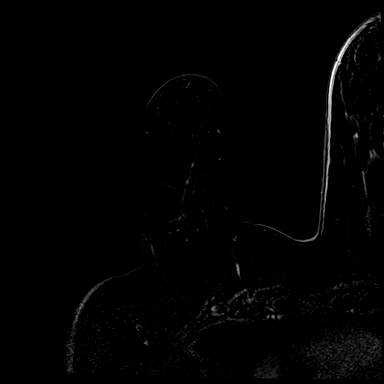
[im 86/144]
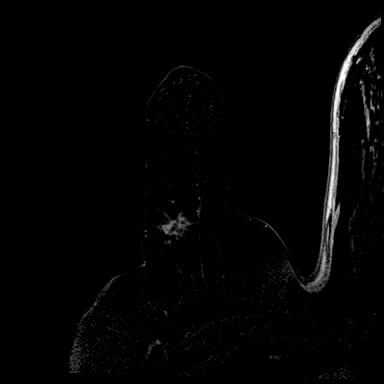
[im 115/144]
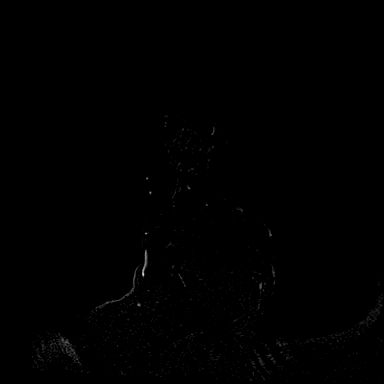
[im 144/144]
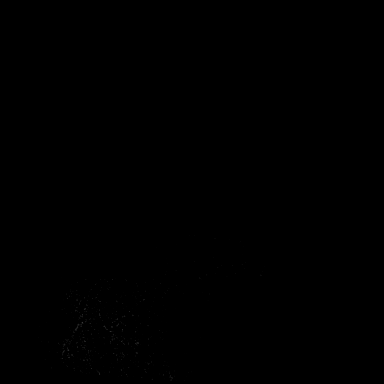

[Series 7: needle confirmation · axial · 1.3mm · 0.73mm/px · z∈[-50,+136]mm · 6 of 144 slices shown]
[im 1/144]
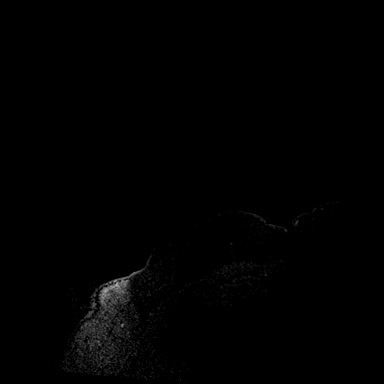
[im 29/144]
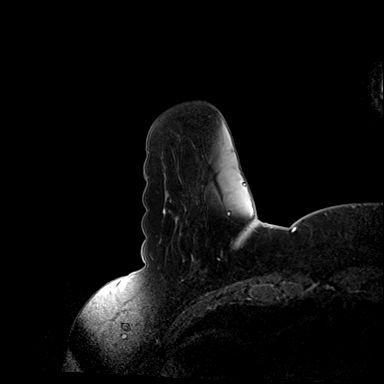
[im 58/144]
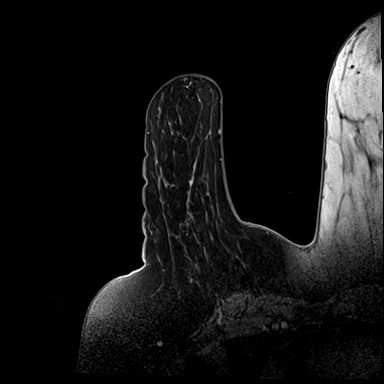
[im 86/144]
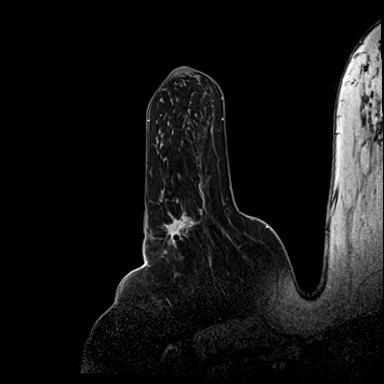
[im 115/144]
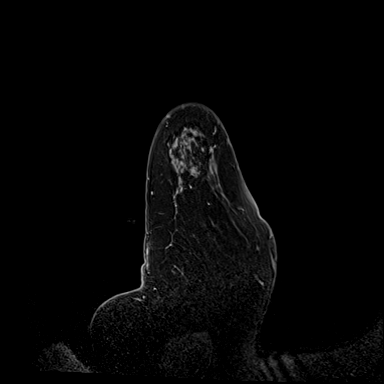
[im 144/144]
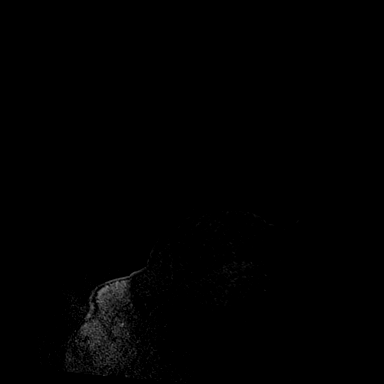

[Series 8: needle confirmation_sub · axial · 1.3mm · 0.73mm/px · z∈[-50,+24]mm · 3 of 144 slices shown]
[im 1/144]
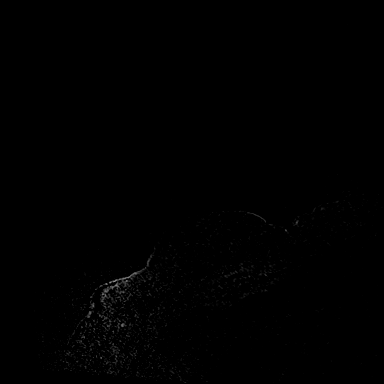
[im 29/144]
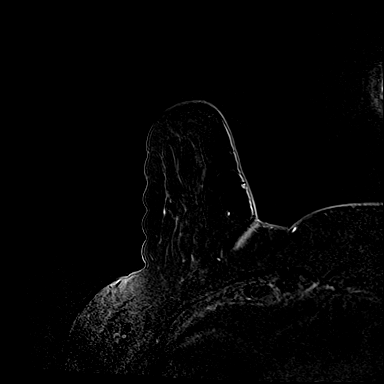
[im 58/144]
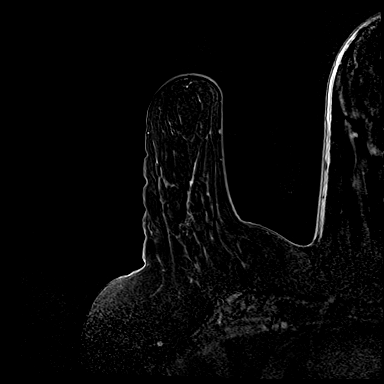

[33 of 48 positions shown; findings below may reference images not displayed]

FINDINGS: I met with the patient, and we discussed the procedure of MRI guided
biopsy, including risks, benefits, and alternatives. Specifically,
we discussed the risks of infection, bleeding, tissue injury, clip
migration, and inadequate sampling. Informed, written consent was
given. The usual time out protocol was performed immediately prior
to the procedure.

Using sterile technique, 1% Lidocaine, MRI guidance, and a 9 gauge
vacuum assisted device, biopsy was performed of the targeted 9 mm
enhancing mass using a lateral to medial approach. At the conclusion
of the procedure, a barbell shaped tissue marker clip was deployed
into the biopsy cavity. Follow-up 2-view mammogram was performed and
dictated separately.
IMPRESSION: MRI guided biopsy of 9 mm indeterminate mass right outer mid breast.
No apparent complications.

ADDENDUM:
Pathology revealed INVASIVE AND IN SITU LOBULAR CARCINOMA of the
Right breast, mid to post [DATE] out mid below known cancer. This was
found to be concordant by Dr. SANFELIZ.

Pathology results were discussed with the patient by telephone. The
patient reported doing well after the biopsy with tenderness at the
site. Post biopsy instructions and care were reviewed and questions
were answered. The patient was encouraged to call The [REDACTED] for any additional concerns. My direct phone
number was provided for the patient.

The patient has a recent diagnosis of Right breast cancer and should
follow her outlined treatment plan.

Dr. SANFELIZ was notified of biopsy results via [REDACTED] message on
[DATE].

Pathology results reported by SANFELIZ, RN on [DATE].

*** End of Addendum ***
FINDINGS: I met with the patient, and we discussed the procedure of MRI guided
biopsy, including risks, benefits, and alternatives. Specifically,
we discussed the risks of infection, bleeding, tissue injury, clip
migration, and inadequate sampling. Informed, written consent was
given. The usual time out protocol was performed immediately prior
to the procedure.

Using sterile technique, 1% Lidocaine, MRI guidance, and a 9 gauge
vacuum assisted device, biopsy was performed of the targeted 9 mm
enhancing mass using a lateral to medial approach. At the conclusion
of the procedure, a barbell shaped tissue marker clip was deployed
into the biopsy cavity. Follow-up 2-view mammogram was performed and
dictated separately.
IMPRESSION: MRI guided biopsy of 9 mm indeterminate mass right outer mid breast.
No apparent complications.

## 2020-01-16 IMAGING — MG MM BREAST LOCALIZATION CLIP
4 series · 4 of 12 positions shown · non-contrast
Comparison: Previous exams.

CLINICAL DATA: Patient is post MRI guided biopsy of a 9 mm
enhancing indeterminate mass over the outer midportion of the right
breast just below recent site of biopsy-proven malignancy
(ultrasound biopsy) and in the general area of a second
biopsy-proven site of malignancy (stereotactic biopsy).

EXAM:
DIAGNOSTIC RIGHT MAMMOGRAM POST MRI BIOPSY

[R ML synth-2D]
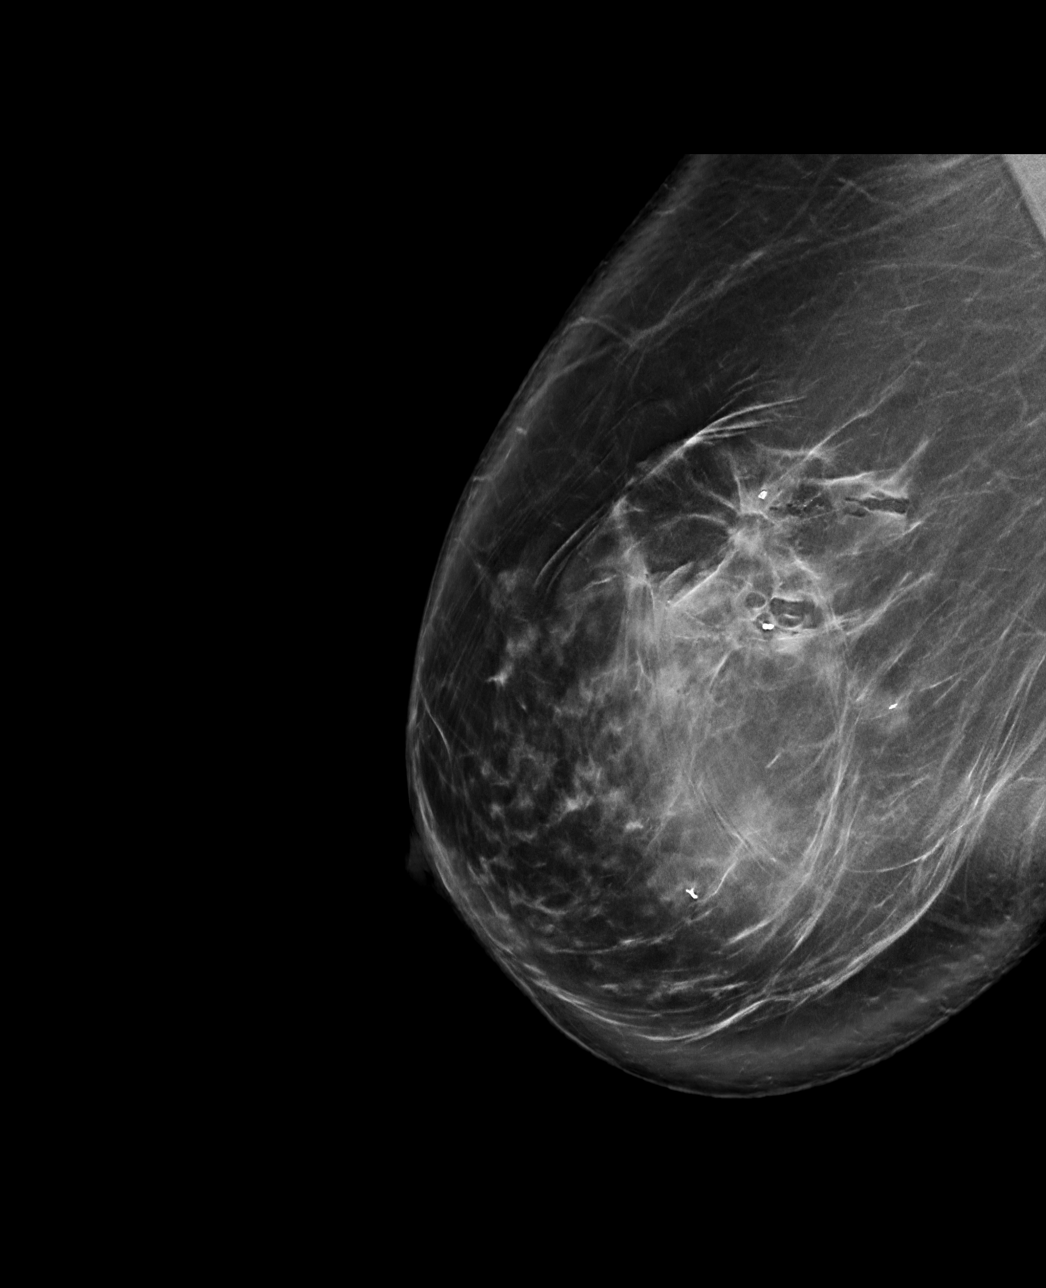

[R CC synth-2D]
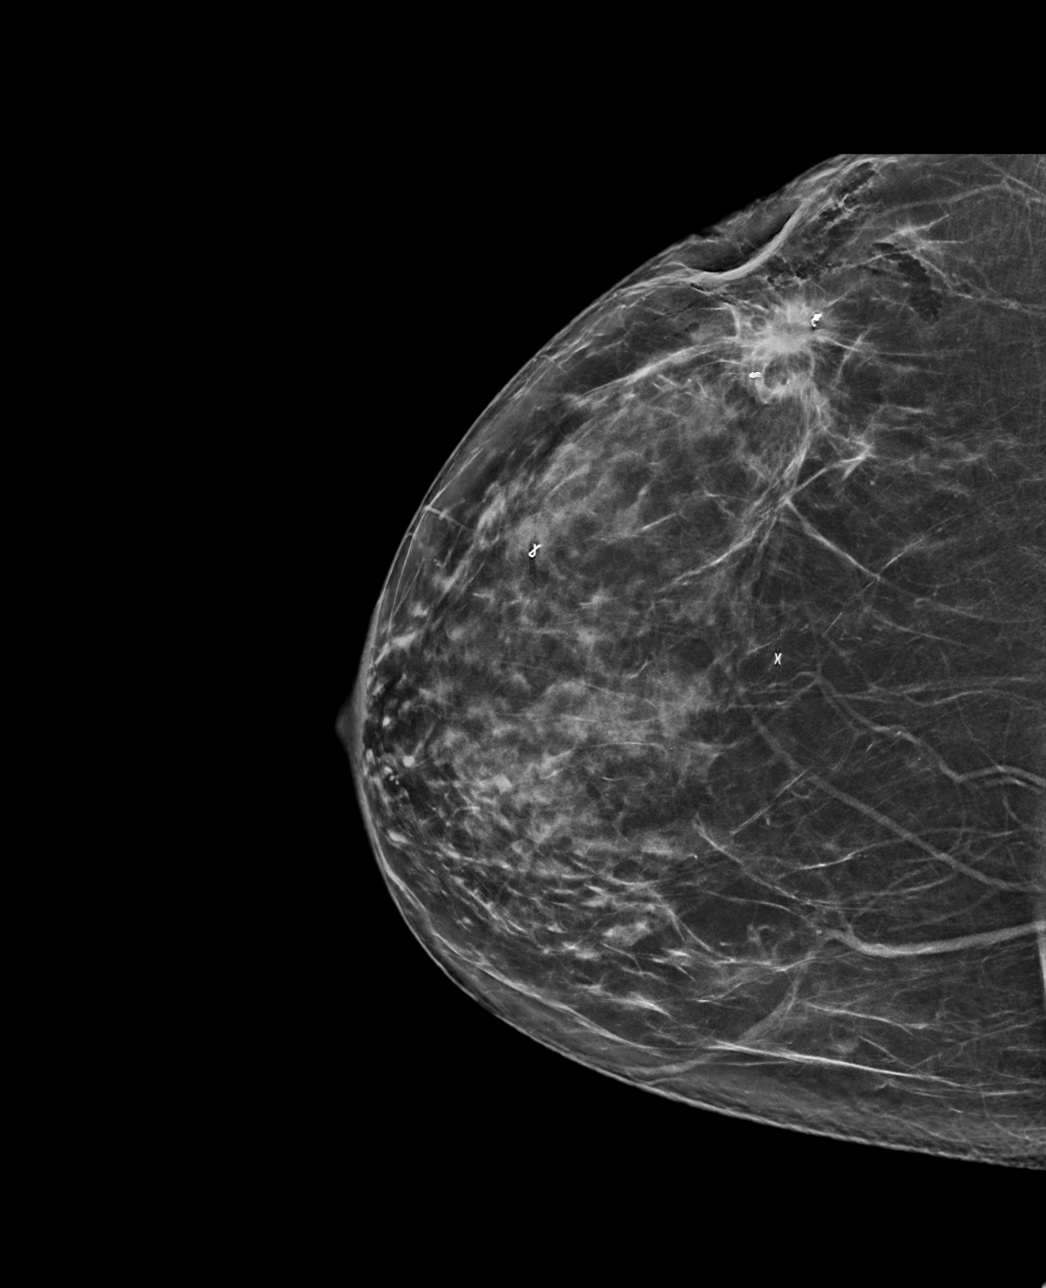

[R CC tomo · tomo slice 39/78.0]
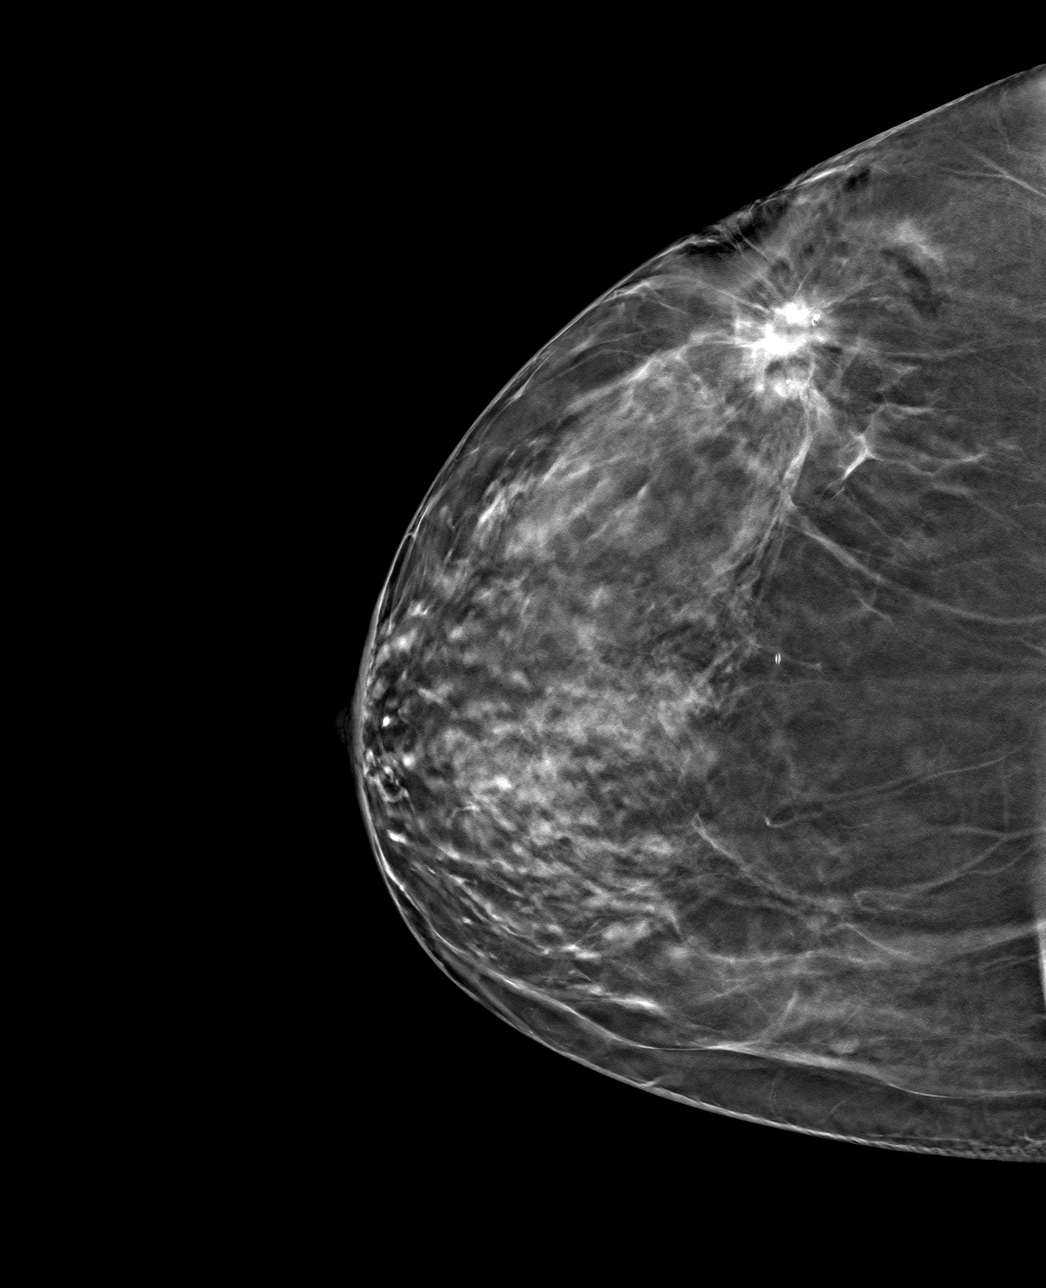

[R ML tomo · tomo slice 51/101.0]
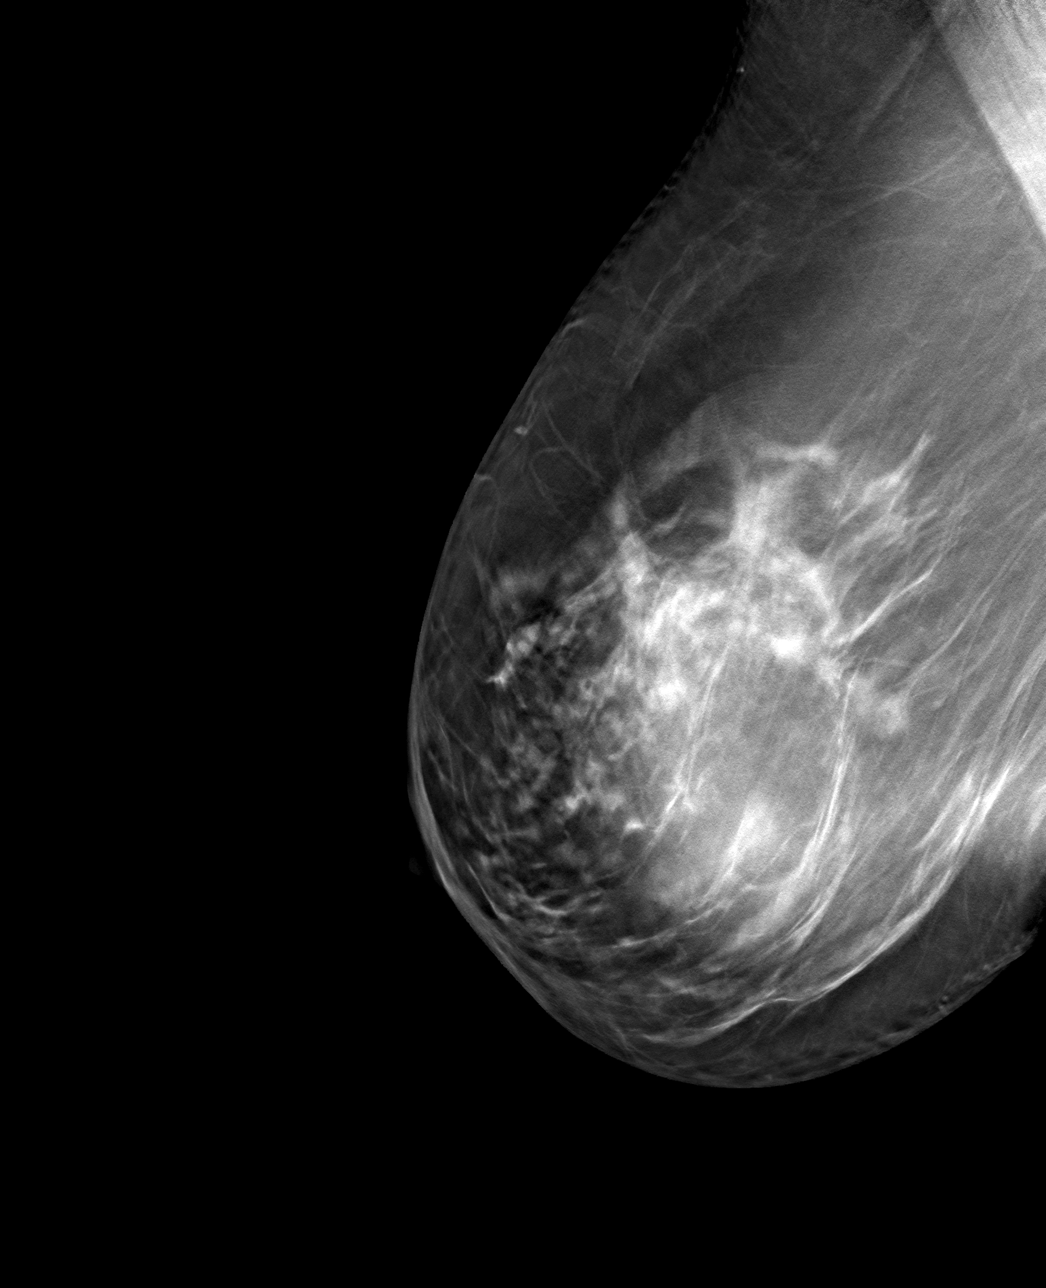

[4 of 12 positions shown; findings below may reference images not displayed]

FINDINGS: Mammographic images were obtained following MRI guided biopsy of an
indeterminate 9 mm mass. The biopsy marking clip is in expected
position at the site of biopsy.
IMPRESSION: Appropriate positioning of the dumbbell shaped biopsy marking clip
at the site of biopsy in the outer midportion of the right breast.

Final Assessment: Post Procedure Mammograms for Marker Placement

## 2020-01-16 MED ORDER — GADOBUTROL 1 MMOL/ML IV SOLN
10.0000 mL | Freq: Once | INTRAVENOUS | Status: AC | PRN
  Administered 2020-01-16: 10 mL via INTRAVENOUS

## 2020-01-17 ENCOUNTER — Encounter: Payer: Self-pay | Admitting: *Deleted

## 2020-01-18 ENCOUNTER — Ambulatory Visit: Payer: Self-pay | Admitting: Surgery

## 2020-01-18 NOTE — H&P (Signed)
The patient had a third area biopsied under MR guidance.  This also showed invasive lobular carcinoma.  I reviewed the images and discussed with the patient.  This covers too wide of an area for an acceptable breast conserving surgery.  She has opted for right mastectomy with SLNB, followed by immediate reconstruction.  We will refer her to Dr. Iran Planas for consultation re: reconstruction.  Cynthia Shaw. Georgette Dover, MD, [facility]  General/ Trauma Surgery   01/18/2020 9:17 AM

## 2020-01-19 ENCOUNTER — Other Ambulatory Visit (HOSPITAL_COMMUNITY): Payer: BC Managed Care – PPO

## 2020-01-21 ENCOUNTER — Telehealth: Payer: Self-pay | Admitting: *Deleted

## 2020-01-21 NOTE — Progress Notes (Signed)
[place]   [phone] [phone]   Clinic Follow up Note   Patient Care Team: Arsenio Katz, NP as PCP - General (Nurse Practitioner) Rockwell Germany, RN as Oncology Nurse Navigator Mauro Kaufmann, RN as Oncology Nurse Navigator Donnie Mesa, MD as Consulting Physician (General Surgery) Truitt Merle, MD as Consulting Physician (Hematology) Kyung Rudd, MD as Consulting Physician (Radiation Oncology)   I connected with Cynthia Shaw on 01/22/2020 at  8:40 AM EDT by telephone visit and verified that I am speaking with the correct person using two identifiers.  I discussed the limitations, risks, security and privacy concerns of performing an evaluation and management service by telephone and the availability of in person appointments. I also discussed with the patient that there may be a patient responsible charge related to this service. The patient expressed understanding and agreed to proceed.   Other persons participating in the visit and their role in the encounter:  None  Patient's location:  Her home  Provider's location:  My Office  CHIEF COMPLAINT: F/u of right breast cancer  SUMMARY OF ONCOLOGIC HISTORY: Oncology History Overview Note  Cancer Staging Malignant neoplasm of upper-outer quadrant of right breast in female, estrogen receptor positive ([place]) Staging form: Breast, AJCC 8th Edition - Clinical stage from 12/26/2019: Stage IB (cT2, cN0, cM0, G2, ER+, PR+, HER2-) - Unsigned    Malignant neoplasm of upper-outer quadrant of right breast in female, estrogen receptor positive ([place])  12/20/2019 Initial Biopsy   Diagnosis 1. Breast, right, needle core biopsy, 7:30 o'clock, 4 cmfn, ribbon clip - FIBROCYSTIC CHANGES. - THERE IS NO EVIDENCE OF MALIGNANCY. - SEE COMMENT. 2. Breast, right, needle core biopsy, 9 o'clock, 5 cmfn, coil clip - INVASIVE MAMMARY CARCINOMA. - SEE COMMENT. 3. Breast, right, needle core biopsy, outer, x  clip - INVASIVE MAMMARY CARCINOMA. - ATYPICAL LOBULAR HYPERPLASIA WITH CALCIFICATIONS. - SEE COMMENT. Microscopic Comment 2. The carcinoma appears at least grade 2. The longest span of tumor is 1.2 cm. An E-Cadherin and a breast prognostic profile will be performed on part 2 and the results reported separately. Dr. Jeannie Done has reviewed part 2 and concurs with this interpretation. The results were called to [facility] on 12/21/2019. 3. The carcinoma in part 3 is morphologically dissimilar from that in part 2. The longest span of tumor is 0.1 cm. An E-Cadherin and a breast prognostic profile will also be performed on part 3 and the results reported separately. ([facility] 12/21/19)   12/20/2019 Receptors her2   2. PROGNOSTIC INDICATORS Results: IMMUNOHISTOCHEMICAL AND MORPHOMETRIC ANALYSIS PERFORMED MANUALLY The tumor cells are NEGATIVE for Her2 (1+). Estrogen Receptor: 95%, POSITIVE, STRONG STAINING INTENSITY Progesterone Receptor: 95%, POSITIVE, STRONG STAINING INTENSITY Proliferation Marker Ki67: 15%   3. Prognostic indicators:  HER2 NEGATIVE  ER: 95% POSITIVE STRONG STAINING PR 90% POSITIVE STRONG STAINING  Proliferation Marker Ki67: 20%   12/20/2019 Mammogram   Diagnostic Mammogram  IMPRESSION    12/25/2019 Initial Diagnosis   Malignant neoplasm of upper-outer quadrant of right breast in female, estrogen receptor positive ([place])   12/29/2019 Breast MRI   IMPRESSION: 1. 2.5 cm spiculated mass in the upper outer right breast at middle depth consistent with the patient's biopsy-proven malignancy (coil clip). 2. Additional 9 mm enhancing mass located approximately 3 cm inferior to the index lesion. This is seen adjacent to a biopsy tract in the central right breast and may represents the additional biopsied focus of malignancy. The associated X-shaped clip was displaced  4 cm medially at the time of biopsy. 3. No MRI evidence of malignancy on the left. 4. No  suspicious lymphadenopathy.   01/02/2020 Genetic Testing   Negative genetic testing:  No pathogenic variants detected on the [facility] Breast Cancer STAT panel. The report date is 01/02/2020.  The Breast Cancer STAT Panel offered by [facility] includes sequencing and deletion/duplication analysis for the following 9 genes:  ATM, BRCA1, BRCA2, CDH1, CHEK2, PALB2, PTEN, STK11 and TP53.  Additional genetic testing through the Common Hereditary Cancers Panel is pending.   01/16/2020 Pathology Results   Diagnosis Breast, right, needle core biopsy, mid to post 1/3 out mid below known cancer - INVASIVE AND IN SITU LOBULAR CARCINOMA. - SEE MICROSCOPIC DESCRIPTION. Microscopic Comment The greatest dimension in a single core is 0.8 cm.   01/24/2020 -  Neo-Adjuvant Anti-estrogen oral therapy   Exemestane 42m once daily starting in 01/24/20.       CURRENT THERAPY:  -Exemestane 279monce daily starting 01/24/20.  -PENDING Surgery   INTERVAL HISTORY:  Cynthia Shaw here for a follow up. She notes she cannot sleep well, about 2-3 hours a night. She notes her mood has been effected with recent cancer diagnosis. She was on Wellbutrin before and would like to restart this or another antidepressant.     REVIEW OF SYSTEMS:   Constitutional: Denies fevers, chills or abnormal weight loss Eyes: Denies blurriness of vision Ears, nose, mouth, throat, and face: Denies mucositis or sore throat Respiratory: Denies cough, dyspnea or wheezes Cardiovascular: Denies palpitation, chest discomfort or lower extremity swelling Gastrointestinal:  Denies nausea, heartburn or change in bowel habits Skin: Denies abnormal skin rashes Lymphatics: Denies new lymphadenopathy or easy bruising Neurological:Denies numbness, tingling or new weaknesses Behavioral/Psych: Mood is stable, no new changes (+) Anxiety  All other systems were reviewed with the patient and are negative.  MEDICAL HISTORY:  Past Medical History:   Diagnosis Date  . Anxiety   [place] [facility] [place]   right breast invasive lobular cancer  . Depression   . Family history of colon cancer   . Family history of melanoma   . Family history of throat cancer   . Family history of uterine cancer     SURGICAL HISTORY: Past Surgical History:  Procedure Laterality Date  . c sections      x3  . CESAREAN SECTION    . KNEE ARTHROSCOPY Left   . TONSILLECTOMY      I have reviewed the social history and family history with the patient and they are unchanged from previous note.  ALLERGIES:  is allergic to tylenol with codeine #3 [acetaminophen-codeine] and sulfa antibiotics.  MEDICATIONS:  Current Outpatient Medications  Medication Sig Dispense Refill  . acetaminophen (TYLENOL) 500 MG tablet Take 500 mg by mouth every 6 (six) hours as needed.    . Marland Kitchenxemestane (AROMASIN) 25 MG tablet Take 1 tablet (25 mg total) by mouth daily after breakfast. 30 tablet 2  . ibuprofen (ADVIL) 800 MG tablet Take by mouth.    . venlafaxine XR (EFFEXOR-XR) 37.5 MG 24 hr capsule Take 1 capsule (37.5 mg total) by mouth daily with breakfast. 30 capsule 0   No current facility-administered medications for this visit.    PHYSICAL EXAMINATION: ECOG PERFORMANCE STATUS: 0 - Asymptomatic  No vitals taken today, Exam not performed today   LABORATORY DATA:  I have reviewed the data as listed CBC Latest Ref Rng & Units 12/26/2019  WBC 4.0 - 10.5 K/uL 8.0  Hemoglobin  12.0 - 15.0 g/dL 15.1(H)  Hematocrit 36 - 46 % 44.8  Platelets 150 - 400 K/uL 272     CMP Latest Ref Rng & Units 12/26/2019  Glucose 70 - 99 mg/dL 100(H)  BUN 6 - 20 mg/dL 17  Creatinine 0.44 - 1.00 mg/dL 0.94  Sodium 135 - 145 mmol/L 142  Potassium 3.5 - 5.1 mmol/L 4.2  Chloride 98 - 111 mmol/L 106  CO2 22 - 32 mmol/L 26  Calcium 8.9 - 10.3 mg/dL 10.4(H)  Total Protein 6.5 - 8.1 g/dL 7.6  Total Bilirubin 0.3 - 1.2 mg/dL 0.6  Alkaline Phos 38 - 126 U/L 67  AST 15 - 41 U/L 30  ALT 0 - 44  U/L 53(H)      RADIOGRAPHIC STUDIES: I have personally reviewed the radiological images as listed and agreed with the findings in the report. No results found.   ASSESSMENT & PLAN:  Cynthia Shaw is a 59 y.o. female with    1. Malignant neoplasm of upper-outer quadrant of right breast, invasive lobular carcinoma, stage IB, c(T2N0M0), ER/PR+/HER2-, Grade II -She was recently diagnosed in 11/2019. She has a 2.6cm mass at 9:00 position of right breast and another small mass adjacent, both with invasive lobular carcinoma.  -Her Breast MRI from 12/29/19 shows her known 2.6cm mass and another 28m mass of right breast. The 939mmass was biopsied on 01/16/20 which showed invasive and IN SITU lobular carcinoma. She overall has 3 right breast lesions of multifocal cancer.  -She has discussed her pathology with Dr TsGeorgette Doverho recommends Mastectomy at this point. She is agreeable with mastectomy and plans to have breast reconstruction. She will consult with plastic surgeon today.  -I discussed that her surgery will take time to set up. Given the strong ER and PR expression in her postmenopausal status, I discussed the option of starting with neoadjuvant antiestrogen therapy to help control her disease while she waits for surgery. I recommend Aromatase inhibitors such as exemestane given her arthritis.   -The potential benefit and side effects, which includes but not limited to, hot flash, skin and vaginal dryness, metabolic changes ( increased blood glucose, cholesterol, weight, etc.), slightly in increased risk of cardiovascular disease, cataracts, muscular and joint discomfort, osteopenia and osteoporosis, etc, were discussed with her in great details. She agreed to start.  -We also discussed the role of neoadjuvant chemotherapy, based on the Oncotype results.  She is not interested in chemotherapy before surgery. -After surgery and Oncotype results and surgical HER2 expression, I will discuss her treatment  plans including Adjuvant Radiation with Dr MoLisbeth Renshaw   2. H/o Cervical Cancer, On situ carcinoma, Genetic Testing -[facility]  -She has family history of Endometrial cancer, ADH, and colon cancer. -She has negative genetic testing:  No pathogenic variants detected on the [facility] Breast Cancer STAT panel. The report date is 01/02/2020.   3. Anxiety/Depression  -She notes she has been anxious and depressed about her recent cancer diagnosis.  -I offered her to chance to speak with her SW for counseling. She declined for now.  -She was on Lexapro before which caused weight gain. She is now interested in managing her mood with medication. I will call in low dose Effexor (01/22/20).   4. Arthritis, Weight gain -She has chronic left hip and back pain since 2019. This resolved after shots to area and weight loss.  -She did gain weight during  COVID in 2020 and has pain has recurred in the past 6 months.  -Will watch on Exemestane.  -She plans to restart weight watchers which she has had success with. I recommend she not lose too much weight before surgery but I encouraged her to continue afterward.   5. Smoking Cessation  -She does smoke less currently, a few cigarettes a day. I discussed complete smoking cessation as this can effect how she heals from surgery.  -She is willing to quit but notes it is hard with recent anxiety and depression. She declined antidepressants for now.    PLAN:  -I called in Exemestane and Effexor today  -Proceed with Surgery (mastectomy and reconstruction) -We will send her surgical sample to Oncotype or MammaPrint -F/u after surgery   No problem-specific Assessment & Plan notes found for this encounter.   No orders of the defined types were placed in this encounter.  I discussed the assessment and treatment plan with the patient. The patient was provided an opportunity to  ask questions and all were answered. The patient agreed with the plan and demonstrated an understanding of the instructions.  The patient was advised to call back or seek an in-person evaluation if the symptoms worsen or if the condition fails to improve as anticipated.  The total time spent in the appointment was 25 minutes.    Truitt Merle, MD 01/22/2020   I, Joslyn Devon, am acting as scribe for Truitt Merle, MD.   I have reviewed the above documentation for accuracy and completeness, and I agree with the above.

## 2020-01-21 NOTE — Telephone Encounter (Signed)
Received email from patient regarding the delay of her surgery.  Per Dr. Burr Medico she would like to see patient 6/29 at 8:40 to discuss AI.  Patient would also like to discuss having a prescription for anti-depressant.  Confirmed appointment for 6/29 at 840 phone visit.

## 2020-01-22 ENCOUNTER — Inpatient Hospital Stay (HOSPITAL_BASED_OUTPATIENT_CLINIC_OR_DEPARTMENT_OTHER): Payer: BC Managed Care – PPO | Admitting: Hematology

## 2020-01-22 ENCOUNTER — Encounter: Payer: Self-pay | Admitting: *Deleted

## 2020-01-22 ENCOUNTER — Other Ambulatory Visit: Payer: Self-pay

## 2020-01-22 DIAGNOSIS — C50411 Malignant neoplasm of upper-outer quadrant of right female breast: Secondary | ICD-10-CM | POA: Diagnosis not present

## 2020-01-22 DIAGNOSIS — Z170 Estrogen receptor positive status [ER+]: Secondary | ICD-10-CM | POA: Diagnosis not present

## 2020-01-22 MED ORDER — VENLAFAXINE HCL ER 37.5 MG PO CP24
37.5000 mg | ORAL_CAPSULE | Freq: Every day | ORAL | 0 refills | Status: DC
Start: 2020-01-22 — End: 2020-02-18

## 2020-01-22 MED ORDER — EXEMESTANE 25 MG PO TABS
25.0000 mg | ORAL_TABLET | Freq: Every day | ORAL | 2 refills | Status: DC
Start: 2020-01-22 — End: 2020-03-20

## 2020-01-23 ENCOUNTER — Inpatient Hospital Stay: Admission: RE | Admit: 2020-01-23 | Payer: BC Managed Care – PPO | Source: Ambulatory Visit

## 2020-01-23 ENCOUNTER — Telehealth: Payer: Self-pay | Admitting: Genetic Counselor

## 2020-01-23 ENCOUNTER — Ambulatory Visit (HOSPITAL_COMMUNITY): Payer: BC Managed Care – PPO

## 2020-01-23 ENCOUNTER — Ambulatory Visit: Payer: Self-pay | Admitting: Surgery

## 2020-01-23 ENCOUNTER — Ambulatory Visit (HOSPITAL_BASED_OUTPATIENT_CLINIC_OR_DEPARTMENT_OTHER): Admission: RE | Admit: 2020-01-23 | Payer: BC Managed Care – PPO | Source: Home / Self Care | Admitting: Surgery

## 2020-01-23 SURGERY — BREAST LUMPECTOMY WITH RADIOACTIVE SEED AND SENTINEL LYMPH NODE BIOPSY
Anesthesia: General | Site: Breast | Laterality: Right

## 2020-01-23 NOTE — Telephone Encounter (Signed)
LVM that the results from the second part of her genetic testing are available and requested that she call back to discuss them.

## 2020-01-23 NOTE — Progress Notes (Signed)
Nutrition  Patient identified by attending Breast Clinic on 12/26/19.  Patient was given nutrition packet with RD contact information at that time.    Chart reviewed.  Patient with breast cancer.  Planning to start AI, followed by mastectomy with reconstruction, oncotype followed by radiation.    Patient with no weight loss  Ht: 68 inches Wt: 246 lb BMI 37  Patient currently not at nutritional risk.  Patient was given nutrition packet along with contact information. RD available as needed.  Jefrey Raburn B. Zenia Resides, [place], [place] Registered Dietitian [phone] (pager)

## 2020-01-24 ENCOUNTER — Encounter: Payer: Self-pay | Admitting: Hematology

## 2020-01-25 ENCOUNTER — Telehealth: Payer: Self-pay | Admitting: Hematology

## 2020-01-25 NOTE — Telephone Encounter (Signed)
No 6/29 los  °

## 2020-02-05 ENCOUNTER — Encounter: Payer: Self-pay | Admitting: *Deleted

## 2020-02-08 ENCOUNTER — Telehealth: Payer: Self-pay | Admitting: Genetic Counselor

## 2020-02-08 ENCOUNTER — Ambulatory Visit: Payer: Self-pay | Admitting: Genetic Counselor

## 2020-02-08 DIAGNOSIS — Z1379 Encounter for other screening for genetic and chromosomal anomalies: Secondary | ICD-10-CM

## 2020-02-08 NOTE — Progress Notes (Signed)
HPI:  Ms. Todorov was previously seen in the [place] clinic due to a personal and family history of cancer and concerns regarding a hereditary predisposition to cancer. Please refer to our prior cancer genetics clinic note for more information regarding our discussion, assessment and recommendations, at the time. Ms. Vanbenschoten's recent genetic test results were disclosed to her, as were recommendations warranted by these results. These results and recommendations are discussed in more detail below.  CANCER HISTORY:  Oncology History Overview Note  Cancer Staging Malignant neoplasm of upper-outer quadrant of right breast in female, estrogen receptor positive ([place] [facility]) Staging form: Breast, AJCC 8th Edition - Clinical stage from 12/26/2019: Stage IB (cT2, cN0, cM0, G2, ER+, PR+, HER2-) - Unsigned    Malignant neoplasm of upper-outer quadrant of right breast in female, estrogen receptor positive ([place])  12/20/2019 Initial Biopsy   Diagnosis 1. Breast, right, needle core biopsy, 7:30 o'clock, 4 cmfn, ribbon clip - FIBROCYSTIC CHANGES. - THERE IS NO EVIDENCE OF MALIGNANCY. - SEE COMMENT. 2. Breast, right, needle core biopsy, 9 o'clock, 5 cmfn, coil clip - INVASIVE MAMMARY CARCINOMA. - SEE COMMENT. 3. Breast, right, needle core biopsy, outer, x clip - INVASIVE MAMMARY CARCINOMA. - ATYPICAL LOBULAR HYPERPLASIA WITH CALCIFICATIONS. - SEE COMMENT. Microscopic Comment 2. The carcinoma appears at least grade 2. The longest span of tumor is 1.2 cm. An E-Cadherin and a breast prognostic profile will be performed on part 2 and the results reported separately. Dr. Jeannie Done has reviewed part 2 and concurs with this interpretation. The results were called to [facility] on 12/21/2019. 3. The carcinoma in part 3 is morphologically dissimilar from that in part 2. The longest span of tumor is 0.1 cm. An E-Cadherin and a breast prognostic profile will also be performed on part 3  and the results reported separately. (JBK:ah 12/21/19)   12/20/2019 Receptors her2   2. PROGNOSTIC INDICATORS Results: IMMUNOHISTOCHEMICAL AND MORPHOMETRIC ANALYSIS PERFORMED MANUALLY The tumor cells are NEGATIVE for Her2 (1+). Estrogen Receptor: 95%, POSITIVE, STRONG STAINING INTENSITY Progesterone Receptor: 95%, POSITIVE, STRONG STAINING INTENSITY Proliferation Marker Ki67: 15%   3. Prognostic indicators:  HER2 NEGATIVE  ER: 95% POSITIVE STRONG STAINING PR 90% POSITIVE STRONG STAINING  Proliferation Marker Ki67: 20%   12/20/2019 Mammogram   Diagnostic Mammogram  IMPRESSION    12/25/2019 Initial Diagnosis   Malignant neoplasm of upper-outer quadrant of right breast in female, estrogen receptor positive ([place])   12/29/2019 Breast MRI   IMPRESSION: 1. 2.5 cm spiculated mass in the upper outer right breast at middle depth consistent with the patient's biopsy-proven malignancy (coil clip). 2. Additional 9 mm enhancing mass located approximately 3 cm inferior to the index lesion. This is seen adjacent to a biopsy tract in the central right breast and may represents the additional biopsied focus of malignancy. The associated X-shaped clip was displaced 4 cm medially at the time of biopsy. 3. No MRI evidence of malignancy on the left. 4. No suspicious lymphadenopathy.   01/02/2020 Genetic Testing   Negative genetic testing:  No pathogenic variants detected on the [facility] Breast Cancer STAT panel or the Common Hereditary Cancers panel. The report dates are 01/02/2020 and 01/08/2020, respectively.  The Breast Cancer STAT Panel offered by [facility] includes sequencing and deletion/duplication analysis for the following 9 genes:  ATM, BRCA1, BRCA2, CDH1, CHEK2, PALB2, PTEN, STK11 and TP53. The Common Hereditary Cancers Panel offered by [facility] includes sequencing and/or deletion duplication testing of the following 47 genes: APC, ATM, AXIN2,  BARD1, BMPR1A, BRCA1, BRCA2, BRIP1, CDH1, CDK4, CDKN2A  (p14ARF), CDKN2A (p16INK4a), CHEK2, CTNNA1, DICER1, EPCAM (Deletion/duplication testing only), GREM1 (promoter region deletion/duplication testing only), KIT, MEN1, MLH1, MSH2, MSH3, MSH6, MUTYH, NBN, NF1, NTHL1, PALB2, PDGFRA, PMS2, POLD1, POLE, PTEN, RAD50, RAD51C, RAD51D, SDHB, SDHC, SDHD, SMAD4, SMARCA4. STK11, TP53, TSC1, TSC2, and VHL.  The following genes were evaluated for sequence changes only: SDHA and HOXB13 c.251G>A variant only.   01/16/2020 Pathology Results   Diagnosis Breast, right, needle core biopsy, mid to post 1/3 out mid below known cancer - INVASIVE AND IN SITU LOBULAR CARCINOMA. - SEE MICROSCOPIC DESCRIPTION. Microscopic Comment The greatest dimension in a single core is 0.8 cm.   01/24/2020 -  Neo-Adjuvant Anti-estrogen oral therapy   Exemestane 22m once daily starting in 01/24/20.      FAMILY HISTORY:  We obtained a detailed, 4-generation family history.  Significant diagnoses are listed below: Family History  Problem Relation Age of Onset   Melanoma Mother 851  Uterine cancer Sister 458  Colon cancer Maternal Grandmother 666  Other Sister 528      precancerous cells of the breast   Throat cancer Paternal Great-grandfather        dx. in his 480s  Ms. Hulme has two sons (ages 339and 227 and one daughter (age 144. She has two sisters (ages 637and 576 and one brother (age 58. Her older sister has a history of uterine cancer diagnosed when she was 442 which was treated with a hysterectomy. Her younger sister has a history of pre-cancerous cells of the breast and is taking tamoxifen.   Ms. Vue's mother is 849and has a history of melanoma diagnosed when she was 861 Ms. NSalminenhas no maternal aunts or uncles (her mother was an only child). Her maternal grandmother died at the age of 64from colon cancer, and her maternal grandfather died when he was 770from heart problems caused by diabetes. There are no other known diagnoses of cancer on the maternal side of  the family.   Ms. Follmer's father died at the age of 863and did not have cancer. She has no paternal aunts or uncles (her father was also an only child). Her paternal grandmother died at the age of 894 and her paternal grandfather died at the age of 490 Neither had cancer. Her great-grandfather (grandfather's father) had throat cancer in his 441s There are no other known diagnoses of cancer on the paternal side of the family.  Ms. NDirdenis unaware of previous family history of genetic testing for hereditary cancer risks. Patient's maternal ancestors are of [place], and paternal ancestors are of [place]. There is no reported Ashkenazi Jewish ancestry. There is no known consanguinity.  GENETIC TEST RESULTS: Genetic testing reported out on 01/02/20 and 01/08/20 through the [facility] Breast Cancer STAT panel and Common Hereditary Cancers panel, respectively. No pathogenic variants were detected.   The Breast Cancer STAT Panel offered by [facility] includes sequencing and deletion/duplication analysis for the following 9 genes:  ATM, BRCA1, BRCA2, CDH1, CHEK2, PALB2, PTEN, STK11 and TP53. The Common Hereditary Cancers Panel offered by [facility] includes sequencing and/or deletion duplication testing of the following 47 genes: APC, ATM, AXIN2, BARD1, BMPR1A, BRCA1, BRCA2, BRIP1, CDH1, CDK4, CDKN2A (p14ARF), CDKN2A (p16INK4a), CHEK2, CTNNA1, DICER1, EPCAM (Deletion/duplication testing only), GREM1 (promoter region deletion/duplication testing only), KIT, MEN1, MLH1, MSH2, MSH3, MSH6, MUTYH, NBN, NF1, NTHL1, PALB2, PDGFRA, PMS2, POLD1, POLE, PTEN,  RAD50, RAD51C, RAD51D, SDHB, SDHC, SDHD, SMAD4, SMARCA4. STK11, TP53, TSC1, TSC2, and VHL.  The following genes were evaluated for sequence changes only: SDHA and HOXB13 c.251G>A variant only. The test report will be scanned into EPIC and located under the Molecular Pathology section of the Results Review tab.  A portion of the result report  is included below for reference.     We discussed with Ms. Fels that because current genetic testing is not perfect, it is possible there may be a gene mutation in one of these genes that current testing cannot detect, but that chance is small.  We also discussed that there could be another gene that has not yet been discovered, or that we have not yet tested, that is responsible for the cancer diagnoses in the family. It is also possible there is a hereditary cause for the cancer in the family that Ms. Hashem did not inherit and therefore was not identified in her testing.  Therefore, it is important to remain in touch with cancer genetics in the future so that we can continue to offer Ms. Wehling the most up to date genetic testing.   CANCER SCREENING RECOMMENDATIONS: Ms. Brickell's test result is considered negative (normal).  This means that we have not identified a hereditary cause for her personal and family history of cancer at this time. Most cancers happen by chance and this negative test suggests that her personal and family of cancer may fall into this category.    While reassuring, this does not definitively rule out a hereditary predisposition to cancer. It is still possible that there could be genetic mutations that are undetectable by current technology. There could be genetic mutations in genes that have not been tested or identified to increase cancer risk.  Therefore, it is recommended she continue to follow the cancer management and screening guidelines provided by her oncology and primary healthcare providers.   An individual's cancer risk and medical management are not determined by genetic test results alone. Overall cancer risk assessment incorporates additional factors, including personal medical history, family history, and any available genetic information that may result in a personalized plan for cancer prevention and surveillance.  RECOMMENDATIONS FOR FAMILY MEMBERS:   Individuals in this family might be at some increased risk of developing cancer, over the general population risk, simply due to the family history of cancer.  We recommended women in this family have a yearly mammogram beginning at age 43, or 44 years younger than the earliest onset of cancer, an annual clinical breast exam, and perform monthly breast self-exams. Women in this family should also have a gynecological exam as recommended by their primary provider. All family members should be referred for colonoscopy starting at age 37.  It is also possible there is a hereditary cause for the cancer in Ms. Coby's family that she did not inherit and therefore was not identified in her.  Based on Ms. Karczewski's family history, we recommended her sister, who was diagnosed with uterine cancer at age 53, have genetic counseling and testing. Ms. Cortner will let us know if we can be of any assistance in coordinating genetic counseling and/or testing for this family member.   FOLLOW-UP: Lastly, we discussed with Ms. Zeman that cancer genetics is a rapidly advancing field and it is possible that new genetic tests will be appropriate for her and/or her family members in the future. We encouraged her to remain in contact with cancer genetics on an annual basis so we  can update her personal and family histories and let her know of advances in cancer genetics that may benefit this family.   Our contact number was provided. Ms. Grizzle's questions were answered to her satisfaction, and she knows she is welcome to call us at anytime with additional questions or concerns.   Clint Guy, MS, [facility] Genetic Counselor [email or web address] Phone: [phone]

## 2020-02-08 NOTE — Telephone Encounter (Signed)
Revealed negative genetic testing. Discussed that we do not know why she has breast cancer or why there is cancer in the family. There could be a genetic mutation in the family that Ms. Beckford did not inherit. There could also be a mutation in a different gene that we are not testing, or our current technology may not be able detect certain mutations. It will therefore be important for her to stay in contact with genetics to keep up with whether additional testing may be appropriate in the future.

## 2020-02-11 ENCOUNTER — Encounter: Payer: Self-pay | Admitting: *Deleted

## 2020-02-15 ENCOUNTER — Ambulatory Visit: Payer: Self-pay | Admitting: Surgery

## 2020-02-15 ENCOUNTER — Encounter (HOSPITAL_BASED_OUTPATIENT_CLINIC_OR_DEPARTMENT_OTHER): Payer: Self-pay | Admitting: Surgery

## 2020-02-15 ENCOUNTER — Other Ambulatory Visit: Payer: Self-pay

## 2020-02-18 ENCOUNTER — Other Ambulatory Visit: Payer: Self-pay | Admitting: *Deleted

## 2020-02-18 MED ORDER — VENLAFAXINE HCL ER 37.5 MG PO CP24: 37.5000 mg | ORAL_CAPSULE | Freq: Every day | ORAL | 4 refills | Status: DC

## 2020-02-18 NOTE — Telephone Encounter (Signed)
Received email from patient with request for refill on her effexor.  She states that she is doing well with it.  Refill sent per request.

## 2020-02-19 ENCOUNTER — Encounter (HOSPITAL_BASED_OUTPATIENT_CLINIC_OR_DEPARTMENT_OTHER)
Admission: RE | Admit: 2020-02-19 | Discharge: 2020-02-19 | Disposition: A | Discharge status: Home / Self Care | Payer: BC Managed Care – PPO | Source: Ambulatory Visit | Attending: Surgery | Admitting: Surgery

## 2020-02-19 ENCOUNTER — Other Ambulatory Visit (HOSPITAL_COMMUNITY)
Admission: RE | Admit: 2020-02-19 | Discharge: 2020-02-19 | Disposition: A | Discharge status: Home / Self Care | Payer: BC Managed Care – PPO | Source: Ambulatory Visit | Attending: Surgery | Admitting: Surgery

## 2020-02-19 ENCOUNTER — Other Ambulatory Visit: Payer: Self-pay

## 2020-02-19 DIAGNOSIS — Z20822 Contact with and (suspected) exposure to covid-19: Secondary | ICD-10-CM | POA: Insufficient documentation

## 2020-02-19 DIAGNOSIS — Z01818 Encounter for other preprocedural examination: Secondary | ICD-10-CM | POA: Insufficient documentation

## 2020-02-19 DIAGNOSIS — Z01810 Encounter for preprocedural cardiovascular examination: Secondary | ICD-10-CM | POA: Insufficient documentation

## 2020-02-19 LAB — SARS CORONAVIRUS 2 (TAT 6-24 HRS): SARS Coronavirus 2: NEGATIVE

## 2020-02-19 NOTE — Progress Notes (Signed)

## 2020-02-22 ENCOUNTER — Other Ambulatory Visit: Payer: Self-pay

## 2020-02-22 ENCOUNTER — Ambulatory Visit (HOSPITAL_BASED_OUTPATIENT_CLINIC_OR_DEPARTMENT_OTHER): Payer: BC Managed Care – PPO | Admitting: Certified Registered"

## 2020-02-22 ENCOUNTER — Encounter (HOSPITAL_BASED_OUTPATIENT_CLINIC_OR_DEPARTMENT_OTHER): Payer: Self-pay | Admitting: Surgery

## 2020-02-22 ENCOUNTER — Encounter (HOSPITAL_COMMUNITY)
Admission: RE | Admit: 2020-02-22 | Discharge: 2020-02-22 | Disposition: A | Discharge status: Home / Self Care | Payer: BC Managed Care – PPO | Source: Ambulatory Visit | Attending: Surgery | Admitting: Surgery

## 2020-02-22 ENCOUNTER — Ambulatory Visit (HOSPITAL_BASED_OUTPATIENT_CLINIC_OR_DEPARTMENT_OTHER)
Admission: RE | Admit: 2020-02-22 | Discharge: 2020-02-23 | Disposition: A | Discharge status: Home / Self Care | Payer: BC Managed Care – PPO | Attending: Surgery | Admitting: Surgery

## 2020-02-22 ENCOUNTER — Encounter (HOSPITAL_BASED_OUTPATIENT_CLINIC_OR_DEPARTMENT_OTHER)
Admission: RE | Disposition: A | Discharge status: Home / Self Care | Payer: Self-pay | Source: Home / Self Care | Attending: Surgery

## 2020-02-22 DIAGNOSIS — Z87891 Personal history of nicotine dependence: Secondary | ICD-10-CM | POA: Diagnosis not present

## 2020-02-22 DIAGNOSIS — C50919 Malignant neoplasm of unspecified site of unspecified female breast: Secondary | ICD-10-CM | POA: Diagnosis present

## 2020-02-22 DIAGNOSIS — Z170 Estrogen receptor positive status [ER+]: Secondary | ICD-10-CM | POA: Insufficient documentation

## 2020-02-22 DIAGNOSIS — C50911 Malignant neoplasm of unspecified site of right female breast: Secondary | ICD-10-CM

## 2020-02-22 DIAGNOSIS — I10 Essential (primary) hypertension: Secondary | ICD-10-CM | POA: Diagnosis not present

## 2020-02-22 HISTORY — DX: Essential (primary) hypertension: I10

## 2020-02-22 HISTORY — PX: MASTECTOMY W/ SENTINEL NODE BIOPSY: SHX2001

## 2020-02-22 SURGERY — MASTECTOMY WITH SENTINEL LYMPH NODE BIOPSY
Anesthesia: General | Site: Breast | Laterality: Right

## 2020-02-22 MED ORDER — CEFAZOLIN SODIUM-DEXTROSE 2-4 GM/100ML-% IV SOLN: 2.0000 g | INTRAVENOUS | Status: DC

## 2020-02-22 MED ORDER — PROPOFOL 10 MG/ML IV BOLUS
INTRAVENOUS | Status: AC
  Filled 2020-02-22: qty 40

## 2020-02-22 MED ORDER — METHYLENE BLUE 0.5 % INJ SOLN
INTRAVENOUS | Status: AC
  Filled 2020-02-22: qty 10

## 2020-02-22 MED ORDER — METHYLENE BLUE 0.5 % INJ SOLN
INTRAVENOUS | Status: DC | PRN
  Administered 2020-02-22: 4 mL via INTRADERMAL

## 2020-02-22 MED ORDER — PROPOFOL 10 MG/ML IV BOLUS
INTRAVENOUS | Status: DC | PRN
  Administered 2020-02-22: 150 mg via INTRAVENOUS

## 2020-02-22 MED ORDER — ACETAMINOPHEN 500 MG PO TABS
ORAL_TABLET | ORAL | Status: AC
  Filled 2020-02-22: qty 2

## 2020-02-22 MED ORDER — AMOXICILLIN 500 MG PO CAPS
500.0000 mg | ORAL_CAPSULE | Freq: Three times a day (TID) | ORAL | Status: DC
  Filled 2020-02-22: qty 1

## 2020-02-22 MED ORDER — ONDANSETRON HCL 4 MG/2ML IJ SOLN
INTRAMUSCULAR | Status: DC | PRN
  Administered 2020-02-22: 4 mg via INTRAVENOUS

## 2020-02-22 MED ORDER — GABAPENTIN 300 MG PO CAPS
300.0000 mg | ORAL_CAPSULE | ORAL | Status: AC
  Administered 2020-02-22: 300 mg via ORAL

## 2020-02-22 MED ORDER — CHLORHEXIDINE GLUCONATE CLOTH 2 % EX PADS: 6.0000 | MEDICATED_PAD | Freq: Once | CUTANEOUS | Status: DC

## 2020-02-22 MED ORDER — GABAPENTIN 300 MG PO CAPS
ORAL_CAPSULE | ORAL | Status: AC
  Filled 2020-02-22: qty 1

## 2020-02-22 MED ORDER — FENTANYL CITRATE (PF) 100 MCG/2ML IJ SOLN
INTRAMUSCULAR | Status: AC
  Filled 2020-02-22: qty 2

## 2020-02-22 MED ORDER — DIPHENHYDRAMINE HCL 25 MG PO CAPS: 25.0000 mg | ORAL_CAPSULE | Freq: Four times a day (QID) | ORAL | Status: DC | PRN

## 2020-02-22 MED ORDER — AMOXICILLIN 500 MG PO CAPS: 500.0000 mg | ORAL_CAPSULE | Freq: Three times a day (TID) | ORAL | Status: DC

## 2020-02-22 MED ORDER — ACETAMINOPHEN 500 MG PO TABS
1000.0000 mg | ORAL_TABLET | ORAL | Status: AC
  Administered 2020-02-22: 1000 mg via ORAL

## 2020-02-22 MED ORDER — BUPIVACAINE HCL (PF) 0.25 % IJ SOLN
INTRAMUSCULAR | Status: AC
  Filled 2020-02-22: qty 30

## 2020-02-22 MED ORDER — OXYCODONE HCL 5 MG PO TABS
5.0000 mg | ORAL_TABLET | Freq: Four times a day (QID) | ORAL | 0 refills | Status: DC | PRN
Start: 2020-02-22 — End: 2020-10-08

## 2020-02-22 MED ORDER — DIPHENHYDRAMINE HCL 50 MG/ML IJ SOLN: 25.0000 mg | Freq: Four times a day (QID) | INTRAMUSCULAR | Status: DC | PRN

## 2020-02-22 MED ORDER — VENLAFAXINE HCL ER 37.5 MG PO CP24
37.5000 mg | ORAL_CAPSULE | Freq: Every day | ORAL | Status: DC
  Filled 2020-02-22: qty 1

## 2020-02-22 MED ORDER — 0.9 % SODIUM CHLORIDE (POUR BTL) OPTIME
TOPICAL | Status: DC | PRN
  Administered 2020-02-22: 700 mL

## 2020-02-22 MED ORDER — ONDANSETRON HCL 4 MG/2ML IJ SOLN: 4.0000 mg | Freq: Four times a day (QID) | INTRAMUSCULAR | Status: DC | PRN

## 2020-02-22 MED ORDER — EXEMESTANE 25 MG PO TABS
25.0000 mg | ORAL_TABLET | Freq: Every day | ORAL | Status: DC
  Filled 2020-02-22: qty 1

## 2020-02-22 MED ORDER — MIDAZOLAM HCL 2 MG/2ML IJ SOLN
INTRAMUSCULAR | Status: AC
  Filled 2020-02-22: qty 2

## 2020-02-22 MED ORDER — MORPHINE SULFATE (PF) 4 MG/ML IV SOLN: 2.0000 mg | INTRAVENOUS | Status: DC | PRN

## 2020-02-22 MED ORDER — FENTANYL CITRATE (PF) 100 MCG/2ML IJ SOLN
100.0000 ug | Freq: Once | INTRAMUSCULAR | Status: AC
  Administered 2020-02-22: 100 ug via INTRAVENOUS

## 2020-02-22 MED ORDER — FENTANYL CITRATE (PF) 100 MCG/2ML IJ SOLN
INTRAMUSCULAR | Status: DC | PRN
  Administered 2020-02-22 (×4): 50 ug via INTRAVENOUS

## 2020-02-22 MED ORDER — MEPERIDINE HCL 25 MG/ML IJ SOLN: 6.2500 mg | INTRAMUSCULAR | Status: DC | PRN

## 2020-02-22 MED ORDER — BUPIVACAINE-EPINEPHRINE (PF) 0.5% -1:200000 IJ SOLN
INTRAMUSCULAR | Status: DC | PRN
  Administered 2020-02-22: 30 mL

## 2020-02-22 MED ORDER — SODIUM CHLORIDE (PF) 0.9 % IJ SOLN
INTRAMUSCULAR | Status: AC
  Filled 2020-02-22: qty 10

## 2020-02-22 MED ORDER — HYDROMORPHONE HCL 1 MG/ML IJ SOLN
INTRAMUSCULAR | Status: AC
  Filled 2020-02-22: qty 0.5

## 2020-02-22 MED ORDER — TECHNETIUM TC 99M SULFUR COLLOID FILTERED
1.0000 | Freq: Once | INTRAVENOUS | Status: AC | PRN
  Administered 2020-02-22: 1 via INTRADERMAL

## 2020-02-22 MED ORDER — GABAPENTIN 300 MG PO CAPS
300.0000 mg | ORAL_CAPSULE | Freq: Two times a day (BID) | ORAL | Status: DC
  Administered 2020-02-22: 300 mg via ORAL
  Filled 2020-02-22: qty 1

## 2020-02-22 MED ORDER — HYDROMORPHONE HCL 1 MG/ML IJ SOLN
0.2500 mg | INTRAMUSCULAR | Status: DC | PRN
  Administered 2020-02-22 (×2): 0.5 mg via INTRAVENOUS

## 2020-02-22 MED ORDER — DEXAMETHASONE SODIUM PHOSPHATE 10 MG/ML IJ SOLN
INTRAMUSCULAR | Status: DC | PRN
  Administered 2020-02-22: 5 mg via INTRAVENOUS

## 2020-02-22 MED ORDER — ACETAMINOPHEN 325 MG PO TABS
650.0000 mg | ORAL_TABLET | Freq: Four times a day (QID) | ORAL | Status: DC | PRN
  Administered 2020-02-22 – 2020-02-23 (×2): 650 mg via ORAL
  Filled 2020-02-22 (×2): qty 2

## 2020-02-22 MED ORDER — LIDOCAINE HCL (CARDIAC) PF 100 MG/5ML IV SOSY
PREFILLED_SYRINGE | INTRAVENOUS | Status: DC | PRN
  Administered 2020-02-22: 100 mg via INTRAVENOUS

## 2020-02-22 MED ORDER — ONDANSETRON 4 MG PO TBDP
4.0000 mg | ORAL_TABLET | Freq: Three times a day (TID) | ORAL | 0 refills | Status: DC | PRN
Start: 2020-02-22 — End: 2020-10-08

## 2020-02-22 MED ORDER — ONDANSETRON HCL 4 MG/2ML IJ SOLN: 4.0000 mg | Freq: Once | INTRAMUSCULAR | Status: DC | PRN

## 2020-02-22 MED ORDER — SODIUM CHLORIDE 0.9 % IV SOLN: INTRAVENOUS | Status: DC

## 2020-02-22 MED ORDER — ONDANSETRON 4 MG PO TBDP: 4.0000 mg | ORAL_TABLET | Freq: Four times a day (QID) | ORAL | Status: DC | PRN

## 2020-02-22 MED ORDER — LISINOPRIL 10 MG PO TABS
10.0000 mg | ORAL_TABLET | Freq: Every day | ORAL | Status: DC
  Administered 2020-02-22: 10 mg via ORAL
  Filled 2020-02-22: qty 1

## 2020-02-22 MED ORDER — CEFAZOLIN SODIUM-DEXTROSE 2-4 GM/100ML-% IV SOLN
INTRAVENOUS | Status: AC
  Filled 2020-02-22: qty 100

## 2020-02-22 MED ORDER — CEFAZOLIN SODIUM-DEXTROSE 2-3 GM-%(50ML) IV SOLR
INTRAVENOUS | Status: DC | PRN
  Administered 2020-02-22: 2 g via INTRAVENOUS

## 2020-02-22 MED ORDER — LACTATED RINGERS IV SOLN: INTRAVENOUS | Status: DC

## 2020-02-22 MED ORDER — OXYCODONE HCL 5 MG PO TABS
5.0000 mg | ORAL_TABLET | ORAL | Status: DC | PRN
  Administered 2020-02-22 – 2020-02-23 (×2): 5 mg via ORAL
  Filled 2020-02-22 (×2): qty 1

## 2020-02-22 MED ORDER — SODIUM CHLORIDE (PF) 0.9 % IJ SOLN
INTRAMUSCULAR | Status: DC | PRN
  Administered 2020-02-22: 1 mL

## 2020-02-22 MED ORDER — LACTATED RINGERS IV SOLN: INTRAVENOUS | Status: DC | PRN

## 2020-02-22 MED ORDER — TRAMADOL HCL 50 MG PO TABS
50.0000 mg | ORAL_TABLET | Freq: Four times a day (QID) | ORAL | Status: DC | PRN
  Administered 2020-02-23: 50 mg via ORAL
  Filled 2020-02-22: qty 1

## 2020-02-22 MED ORDER — ACETAMINOPHEN 325 MG RE SUPP: 650.0000 mg | Freq: Four times a day (QID) | RECTAL | Status: DC | PRN

## 2020-02-22 MED ORDER — LIDOCAINE 2% (20 MG/ML) 5 ML SYRINGE
INTRAMUSCULAR | Status: AC
  Filled 2020-02-22: qty 5

## 2020-02-22 MED ORDER — ONDANSETRON HCL 4 MG/2ML IJ SOLN
INTRAMUSCULAR | Status: AC
  Filled 2020-02-22: qty 2

## 2020-02-22 MED ORDER — DEXAMETHASONE SODIUM PHOSPHATE 10 MG/ML IJ SOLN
INTRAMUSCULAR | Status: AC
  Filled 2020-02-22: qty 1

## 2020-02-22 MED ORDER — MIDAZOLAM HCL 2 MG/2ML IJ SOLN
2.0000 mg | Freq: Once | INTRAMUSCULAR | Status: AC
  Administered 2020-02-22: 2 mg via INTRAVENOUS

## 2020-02-22 MED ORDER — MIDAZOLAM HCL 2 MG/2ML IJ SOLN
INTRAMUSCULAR | Status: DC | PRN
  Administered 2020-02-22: 2 mg via INTRAVENOUS

## 2020-02-22 SURGICAL SUPPLY — 59 items
APL PRP STRL LF DISP 70% ISPRP (MISCELLANEOUS) ×1
APL SKNCLS STERI-STRIP NONHPOA (GAUZE/BANDAGES/DRESSINGS) ×1
APPLIER CLIP 9.375 MED OPEN (MISCELLANEOUS) ×3
APR CLP MED 9.3 20 MLT OPN (MISCELLANEOUS) ×1
BENZOIN TINCTURE PRP APPL 2/3 (GAUZE/BANDAGES/DRESSINGS) ×3 IMPLANT
BINDER BREAST XXLRG (GAUZE/BANDAGES/DRESSINGS) ×3 IMPLANT
BIOPATCH RED 1 DISK 7.0 (GAUZE/BANDAGES/DRESSINGS) ×2 IMPLANT
BIOPATCH RED 1IN DISK 7.0MM (GAUZE/BANDAGES/DRESSINGS) ×1
BLADE CLIPPER SURG (BLADE) IMPLANT
BLADE HEX COATED 2.75 (ELECTRODE) ×3 IMPLANT
BLADE SURG 10 STRL SS (BLADE) ×3 IMPLANT
BLADE SURG 15 STRL LF DISP TIS (BLADE) ×1 IMPLANT
BLADE SURG 15 STRL SS (BLADE) ×3
CANISTER SUCT 1200ML W/VALVE (MISCELLANEOUS) ×3 IMPLANT
CHLORAPREP W/TINT 26 (MISCELLANEOUS) ×3 IMPLANT
CLIP APPLIE 9.375 MED OPEN (MISCELLANEOUS) ×1 IMPLANT
CLOSURE WOUND 1/2 X4 (GAUZE/BANDAGES/DRESSINGS) ×1
COVER BACK TABLE 60X90IN (DRAPES) ×3 IMPLANT
COVER MAYO STAND STRL (DRAPES) ×3 IMPLANT
COVER PROBE W GEL 5X96 (DRAPES) ×3 IMPLANT
COVER WAND RF STERILE (DRAPES) IMPLANT
DECANTER SPIKE VIAL GLASS SM (MISCELLANEOUS) ×3 IMPLANT
DRAIN CHANNEL 19F RND (DRAIN) ×3 IMPLANT
DRAIN HEMOVAC 1/8 X 5 (WOUND CARE) IMPLANT
DRAPE LAPAROSCOPIC ABDOMINAL (DRAPES) ×3 IMPLANT
DRAPE UTILITY XL STRL (DRAPES) ×3 IMPLANT
DRSG PAD ABDOMINAL 8X10 ST (GAUZE/BANDAGES/DRESSINGS) ×3 IMPLANT
ELECT REM PT RETURN 9FT ADLT (ELECTROSURGICAL) ×3
ELECTRODE REM PT RTRN 9FT ADLT (ELECTROSURGICAL) ×1 IMPLANT
EVACUATOR SILICONE 100CC (DRAIN) ×3 IMPLANT
GAUZE SPONGE 4X4 12PLY STRL (GAUZE/BANDAGES/DRESSINGS) ×3 IMPLANT
GAUZE SPONGE 4X4 12PLY STRL LF (GAUZE/BANDAGES/DRESSINGS) IMPLANT
GAUZE XEROFORM 1X8 LF (GAUZE/BANDAGES/DRESSINGS) ×3 IMPLANT
GLOVE BIO SURGEON STRL SZ7 (GLOVE) ×3 IMPLANT
GLOVE BIOGEL PI IND STRL 7.5 (GLOVE) ×1 IMPLANT
GLOVE BIOGEL PI INDICATOR 7.5 (GLOVE) ×2
GOWN STRL REUS W/ TWL LRG LVL3 (GOWN DISPOSABLE) ×2 IMPLANT
GOWN STRL REUS W/TWL LRG LVL3 (GOWN DISPOSABLE) ×6
NDL SAFETY ECLIPSE 18X1.5 (NEEDLE) ×1 IMPLANT
NEEDLE HYPO 18GX1.5 SHARP (NEEDLE) ×3
NEEDLE HYPO 25X1 1.5 SAFETY (NEEDLE) ×6 IMPLANT
NS IRRIG 1000ML POUR BTL (IV SOLUTION) ×3 IMPLANT
PACK BASIN DAY SURGERY FS (CUSTOM PROCEDURE TRAY) ×3 IMPLANT
PENCIL SMOKE EVACUATOR (MISCELLANEOUS) ×3 IMPLANT
PIN SAFETY STERILE (MISCELLANEOUS) ×3 IMPLANT
SLEEVE SCD COMPRESS KNEE MED (MISCELLANEOUS) ×3 IMPLANT
SPONGE LAP 18X18 RF (DISPOSABLE) ×6 IMPLANT
SPONGE LAP 4X18 RFD (DISPOSABLE) IMPLANT
STAPLER VISISTAT 35W (STAPLE) ×3 IMPLANT
STRIP CLOSURE SKIN 1/2X4 (GAUZE/BANDAGES/DRESSINGS) ×2 IMPLANT
SUT ETHILON 2 0 FS 18 (SUTURE) ×3 IMPLANT
SUT SILK 2 0 SH (SUTURE) ×3 IMPLANT
SUT VICRYL 3-0 CR8 SH (SUTURE) ×3 IMPLANT
SYR CONTROL 10ML LL (SYRINGE) ×6 IMPLANT
TOWEL GREEN STERILE FF (TOWEL DISPOSABLE) ×6 IMPLANT
TRAY FAXITRON CT DISP (TRAY / TRAY PROCEDURE) ×3 IMPLANT
TUBE CONNECTING 20'X1/4 (TUBING) ×1
TUBE CONNECTING 20X1/4 (TUBING) ×2 IMPLANT
YANKAUER SUCT BULB TIP NO VENT (SUCTIONS) ×3 IMPLANT

## 2020-02-22 NOTE — Progress Notes (Signed)
Nuc med injection performed by nuc med staff.  No additional sedation required.  Pt tolerated well.  VSS

## 2020-02-22 NOTE — Anesthesia Procedure Notes (Signed)
Anesthesia Regional Block: Pectoralis block   Pre-Anesthetic Checklist: ,, timeout performed, Correct Patient, Correct Site, Correct Laterality, Correct Procedure, Correct Position, site marked, Risks and benefits discussed,  Surgical consent,  Pre-op evaluation,  At surgeon's request and post-op pain management  Laterality: Right  Prep: chloraprep       Needles:  Injection technique: Single-shot     Needle Length: 9cm  Needle Gauge: 21     Additional Needles:   Narrative:  Start time: 02/22/2020 11:50 AM End time: 02/22/2020 12:00 PM Injection made incrementally with aspirations every 5 mL.  Performed by: Personally  Anesthesiologist: Lillia Abed, MD  Additional Notes: Monitors applied. Patient sedated. Sterile prep and drape,hand hygiene and sterile gloves were used. Relevant anatomy identified.Needle position confirmed.Local anesthetic injected incrementally after negative aspiration. Local anesthetic spread visualized. Vascular puncture avoided. No complications. Image printed for medical record.The patient tolerated the procedure well.

## 2020-02-22 NOTE — Anesthesia Postprocedure Evaluation (Signed)
Anesthesia Post Note  Patient: Cynthia Shaw  Procedure(s) Performed: RIGHT MASTECTOMY WITH SENTINEL LYMPH NODE BIOPSY (Right Breast)     Patient location during evaluation: PACU Anesthesia Type: General Level of consciousness: awake and alert Pain management: pain level controlled Vital Signs Assessment: post-procedure vital signs reviewed and stable Respiratory status: spontaneous breathing, nonlabored ventilation, respiratory function stable and patient connected to nasal cannula oxygen Cardiovascular status: blood pressure returned to baseline and stable Postop Assessment: no apparent nausea or vomiting Anesthetic complications: no   No complications documented.  Last Vitals:  Vitals:   02/22/20 1515 02/22/20 1545  BP: (!) 155/90 (!) 146/80  Pulse: 99 84  Resp: 15 16  Temp:  36.9 C  SpO2: 94% 93%    Last Pain:  Vitals:   02/22/20 1545  TempSrc:   PainSc: 0-No pain                 Tiajuana Amass

## 2020-02-22 NOTE — H&P (Signed)
History of Present Illness Cynthia Shaw. Cynthia Leu Cynthia; 12/26/2019 2:25 PM) The patient is a 59 year old female who presents with breast cancer. Cynthia PCP - Cynthia Shaw, Cynthia Shaw  This is a 59 year old female who presents after a recent screening mammogram revealed a couple of suspicious areas in the [place]. She had not had a mammogram in over 10 years. Her sister had surgery for "pre-cancerous" breast mass.  She underwent further work-up including biopsies of three different areas. One mass at 0730 4cmfn showed only benign fibrocystic changes. Another mass at 0900 5 cmfn revealed invasive lobular carcinoma ER/PR +, Her2 -, Ki-67 15%. Another area in the RUOQ revealed a small cluster of calcifications and biopsy showed a similar ILC. Prior to mammogram, she did have occasional twinges of pain but no palpable masses.   The patient is otherwise healthy, other than smoking a few cigarettes a day. She is trying to quit.  CLINICAL DATA: Patient with indeterminate right breast masses.  EXAM: ULTRASOUND GUIDED RIGHT BREAST CORE NEEDLE BIOPSY  COMPARISON: Previous exam(s).  PROCEDURE: I met with the patient and we discussed the procedure of ultrasound-guided biopsy, including benefits and alternatives. We discussed the high likelihood of a successful procedure. We discussed the risks of the procedure, including infection, bleeding, tissue injury, clip migration, and inadequate sampling. Informed written consent was given. The usual time-out protocol was performed immediately prior to the procedure.  Site 1: Right breast 7:30 o'clock: Ribbon shaped clip  Lesion quadrant: Upper outer quadrant  Using sterile technique and 1% Lidocaine as local anesthetic, under direct ultrasound visualization, a 14 gauge spring-loaded device was used to perform biopsy of right breast mass 7:30 o'clock using a lateral approach. At the conclusion of the procedure ribbon shaped tissue  marker clip was deployed into the biopsy cavity. Follow up 2 view mammogram was performed and dictated separately.  Site 2: Right breast 9 o'clock: Coil shaped clip  Lesion quadrant: Upper outer quadrant  Using sterile technique and 1% Lidocaine as local anesthetic, under direct ultrasound visualization, a 14 gauge spring-loaded device was used to perform biopsy of right breast mass 9 o'clock position using a lateral approach. At the conclusion of the procedure coil shaped tissue marker clip was deployed into the biopsy cavity. Follow up 2 view mammogram was performed and dictated separately.  IMPRESSION: Ultrasound guided biopsy of right breast masses 7:30 o'clock and 9 o'clock position. No apparent complications.  Electronically Signed: By: Cynthia Newcomer M.D. On: 12/20/2019 09:15  CLINICAL DATA: Patient with indeterminate right breast calcifications.  EXAM: RIGHT BREAST STEREOTACTIC CORE NEEDLE BIOPSY  COMPARISON: Previous exams.  FINDINGS: The patient and I discussed the procedure of stereotactic-guided biopsy including benefits and alternatives. We discussed the high likelihood of a successful procedure. We discussed the risks of the procedure including infection, bleeding, tissue injury, clip migration, and inadequate sampling. Informed written consent was given. The usual time out protocol was performed immediately prior to the procedure.  Using sterile technique and 1% Lidocaine as local anesthetic, under stereotactic guidance, a 9 gauge vacuum assisted device was used to perform core needle biopsy of calcifications within the outer right breast using a lateral approach. Specimen radiograph was performed showing calcifications. Specimens with calcifications are identified for pathology.  Lesion quadrant: Upper outer quadrant  At the conclusion of the procedure, X shaped tissue marker clip was deployed into the biopsy cavity. Follow-up 2-view mammogram  was performed and dictated separately.  IMPRESSION: Stereotactic-guided  biopsy of right breast calcifications. No apparent complications.  Electronically Signed: By: Cynthia Newcomer M.D. On: 12/20/2019 09:13  CLINICAL DATA: Patient status post ultrasound-guided biopsy right breast mass 7:30 o'clock and 9 o'clock position. Stereotactic guided biopsy right breast calcifications.  EXAM: DIAGNOSTIC RIGHT MAMMOGRAM POST STEREOTACTIC AND ULTRASOUND BIOPSY  COMPARISON: Previous exam(s).  FINDINGS: Site 1: Right breast 7:30 o'clock mass: Ribbon shaped clip: In appropriate position.  Site 2: Right breast 9 o'clock mass: Coil shaped clip: In appropriate position.  Site 3: Right breast calcifications: X shaped clip: Migrated approximately 4 cm medial to the site of biopsied calcifications.  IMPRESSION: Note the X shaped clip has migrated approximately 4 cm medial to the expected site of biopsied calcifications.  The coil shaped and ribbon shaped clips are in appropriate position.  Final Assessment: Post Procedure Mammograms for Marker Placement   Electronically Signed By: Cynthia Newcomer M.D. On: 12/20/2019 09:25   Problem List/Past Medical Cynthia Key K. Dillinger Aston, Cynthia; 12/26/2019 2:25 PM) INVASIVE LOBULAR CARCINOMA OF RIGHT BREAST, STAGE 1 (C50.911)   Allergies Cynthia Slipper, Cynthia Shaw; 12/26/2019 8:18 AM) No Known Allergies  [12/26/2019]:  Medication History Cynthia Slipper, Cynthia Shaw; 12/26/2019 8:18 AM) No Current Medications Medications Reconciled    Physical Exam Cynthia Shaw; 12/26/2019 2:30 PM) The physical exam findings are as follows: Note: Constitutional: WDWN in NAD, conversant, no obvious deformities; resting comfortably Eyes: Pupils equal, round; sclera anicteric; moist conjunctiva; no lid lag HENT: Oral mucosa moist; good dentition Neck: No masses palpated, trachea midline; no thyromegaly Lungs: CTA bilaterally; normal respiratory effort Breasts: symmetric except for some  bruising in the lateral right breast; no dominant masses, no nipple changes, no axillary or supraclavicular lymphadenopathy CV: Regular rate and rhythm; no murmurs; extremities well-perfused with no edema Abd: +bowel sounds, soft, non-tender, no palpable organomegaly; no palpable hernias Musc: Normal gait; no apparent clubbing or cyanosis in extremities Lymphatic: No palpable cervical or axillary lymphadenopathy Skin: Warm, dry; no sign of jaundice Psychiatric - alert and oriented x 4; calm mood and affect    Assessment & Plan Cynthia Shaw; 12/26/2019 2:36 PM) INVASIVE LOBULAR CARCINOMA OF RIGHT BREAST, STAGE 1 (C50.911) Impression: Three sites in right breast - ER/PR+, Her2 -, Ki67 15% Current Plans MRI, BOTH BREASTS ([id number]) Schedule for Surgery - Right mastectomy with sentinel lymph node biopsy. The surgical procedure has been discussed with the patient. Potential risks, benefits, alternative treatments, and expected outcomes have been explained. All of the patient's questions at this time have been answered. The likelihood of reaching the patient's treatment goal is good. The patient understand the proposed surgical procedure and wishes to proceed.  Genetics negative  Cynthia Shaw. Georgette Dover, Cynthia, Cynthia [facility] Surgery  General/ Trauma Surgery   02/22/2020 11:58 AM

## 2020-02-22 NOTE — Op Note (Signed)
Preop diagnosis: Invasive lobular carcinoma-multifocal right breast Postop diagnosis: Same Procedure performed: Right mastectomy with sentinel lymph node biopsy, blue dye injection Surgeon:Tyrianna Lightle K Gisselle Galvis Anesthesia: General with pec block Indications: This is a 59 year old female who presented after routine screening mammogram revealed several suspicious areas in the right breast.  She underwent extensive work-up which revealed 3 separate areas of invasive lobular carcinoma.  She is ER/PR positive.  After further discussion and consultation, she is elected to undergo a right mastectomy without reconstruction.  Clinically she is lymph node negative.  She was injected with technetium sulfur colloid in the preoperative area.  Description of procedure: The patient is brought to the operating room and placed in the supine position on the operating room table.  After an adequate level of general anesthesia was obtained, her right breast was prepped with alcohol and I injected methylene blue dye solution around her nipple.  The entire breast was prepped with ChloraPrep and draped in sterile fashion.  Another timeout was taken.  I outlined an elliptical incision to include the entire nipple areolar complex.  We made our skin incision.  I raised skin flaps inferiorly down to the inframammary crease.  I dissected medially to the edge of the sternum.  Superiorly we dissected to the infraclavicular fascia.  We dissected the muscle off of the underlying pectoralis muscle including the anterior pectoralis fascia with the specimen.  We dissected from medial to lateral.  Once we reached the lateral edge of the pectoralis muscle and encountered the axilla, we performed the sentinel lymph node biopsy.  I interrogated the tail of the breast with the neoprobe.  We identified an area of activity.  I dissected down to this area and encountered a lymph node containing blue dye with radioactivity.  We excised this and sent this as  sentinel lymph node #1.  There is minimal background radioactivity.  We completed our mastectomy by dissecting the specimen off of the anterior surface of the latissimus muscle.  The specimen was oriented with a suture lateral.  We irrigated the wound thoroughly and inspected for hemostasis.  At this point I felt that the patient had too generous of a inferior skin flap so I excised some additional inferior skin flap as well as some underlying breast tissue.  These were sent as a separate specimen.  A 19 French drain was placed through a stab incision in place along the inframammary crease.  This was secured with 2-0 nylon.  We then closed the incision with multiple interrupted 3-0 Vicryl sutures.  Several sutures were used to tack down the dermis to the underlying fascia.  Staples were used to close the skin.  The drain was placed to suction.  A dry dressing is applied.  The patient was then extubated and brought to the recovery room in stable condition.  All sponge, instrument, and needle counts are correct.  Imogene Burn. Georgette Dover, MD, [facility]  General/ Trauma Surgery   02/22/2020 2:19 PM

## 2020-02-22 NOTE — Anesthesia Procedure Notes (Signed)
Procedure Name: LMA Insertion Performed by: Verita Lamb, CRNA Pre-anesthesia Checklist: Patient identified, Emergency Drugs available, Suction available and Patient being monitored Patient Re-evaluated:Patient Re-evaluated prior to induction Oxygen Delivery Method: Circle system utilized Preoxygenation: Pre-oxygenation with 100% oxygen Induction Type: IV induction Ventilation: Mask ventilation without difficulty LMA: LMA inserted LMA Size: 4.0 Number of attempts: 1 Airway Equipment and Method: Bite block Placement Confirmation: positive ETCO2,  CO2 detector and ETT inserted through vocal cords under direct vision Tube secured with: Tape Dental Injury: Teeth and Oropharynx as per pre-operative assessment

## 2020-02-22 NOTE — Anesthesia Preprocedure Evaluation (Signed)
Anesthesia Evaluation  Patient identified by MRN, date of birth, ID band Patient awake    Reviewed: Allergy & Precautions, NPO status , Patient's Chart, lab work & pertinent test results  Airway Mallampati: I  TM Distance: >3 FB Neck ROM: Full    Dental   Pulmonary former smoker,    Pulmonary exam normal        Cardiovascular hypertension, Pt. on medications Normal cardiovascular exam     Neuro/Psych Anxiety Depression    GI/Hepatic   Endo/Other    Renal/GU      Musculoskeletal   Abdominal   Peds  Hematology   Anesthesia Other Findings   Reproductive/Obstetrics                             Anesthesia Physical Anesthesia Plan  ASA: II  Anesthesia Plan: General   Post-op Pain Management:  Regional for Post-op pain   Induction: Intravenous  PONV Risk Score and Plan: 3 and Ondansetron, Midazolam and Treatment may vary due to age or medical condition  Airway Management Planned: LMA  Additional Equipment:   Intra-op Plan:   Post-operative Plan: Extubation in OR  Informed Consent: I have reviewed the patients History and Physical, chart, labs and discussed the procedure including the risks, benefits and alternatives for the proposed anesthesia with the patient or authorized representative who has indicated his/her understanding and acceptance.       Plan Discussed with: CRNA and Surgeon  Anesthesia Plan Comments:         Anesthesia Quick Evaluation

## 2020-02-22 NOTE — Transfer of Care (Addendum)
Immediate Anesthesia Transfer of Care Note  Patient: Cynthia Shaw  Procedure(s) Performed: RIGHT MASTECTOMY WITH SENTINEL LYMPH NODE BIOPSY (Right Breast)  Patient Location: PACU  Anesthesia Type:General  Level of Consciousness: awake, alert  and oriented  Airway & Oxygen Therapy: Patient Spontanous Breathing and Patient connected to face mask oxygen  Post-op Assessment: Report given to RN and Post -op Vital signs reviewed and stable  Post vital signs: Reviewed and stable  Last Vitals:  Vitals Value Taken Time  BP    Temp    Pulse 98 02/22/20 1421  Resp 20 02/22/20 1421  SpO2 97 % 02/22/20 1421  Vitals shown include unvalidated device data.  Last Pain:  Vitals:   02/22/20 1152  TempSrc:   PainSc: 0-No pain      Patients Stated Pain Goal: 5 (71/58/06 3868)  Complications: No complications documented.

## 2020-02-22 NOTE — Progress Notes (Signed)
Assisted Dr. Ossey with right, ultrasound guided, pectoralis block. Side rails up, monitors on throughout procedure. See vital signs in flow sheet. Tolerated Procedure well. 

## 2020-02-23 DIAGNOSIS — C50911 Malignant neoplasm of unspecified site of right female breast: Secondary | ICD-10-CM | POA: Diagnosis not present

## 2020-02-23 NOTE — Discharge Instructions (Signed)
[facility] °[phone] ° °MASTECTOMY: POST OP INSTRUCTIONS ° °Always review your discharge instruction sheet given to you by the facility where your surgery was performed. °IF YOU HAVE DISABILITY OR FAMILY LEAVE FORMS, YOU MUST BRING THEM TO THE OFFICE FOR PROCESSING.   °DO NOT GIVE THEM TO YOUR DOCTOR. °A prescription for pain medication may be given to you upon discharge.  Take your pain medication as prescribed, if needed.  If narcotic pain medicine is not needed, then you may take acetaminophen (Tylenol) or ibuprofen (Advil) as needed. °1. Take your usually prescribed medications unless otherwise directed. °2. If you need a refill on your pain medication, please contact your pharmacy.  They will contact our office to request authorization.  Prescriptions will not be filled after 5pm or on week-ends. °3. You should follow a light diet the first few days after arrival home, such as soup and crackers, etc.  Resume your normal diet the day after surgery. °4. Most patients will experience some swelling and bruising on the chest and underarm.  Ice packs will help.  Swelling and bruising can take several days to resolve.  °5. It is common to experience some constipation if taking pain medication after surgery.  Increasing fluid intake and taking a stool softener (such as Colace) will usually help or prevent this problem from occurring.  A mild laxative (Milk of Magnesia or Miralax) should be taken according to package instructions if there are no bowel movements after 48 hours. °6. Unless discharge instructions indicate otherwise, leave your bandage dry and in place until your next appointment in 3-5 days.  You may take a limited sponge bath.  No tube baths or showers until the drains are removed.  You may have steri-strips (small skin tapes) in place directly over the incision.  These strips should be left on the skin for 7-10 days.  If your surgeon used skin glue on the incision, you may  shower in 24 hours.  The glue will flake off over the next 2-3 weeks.  Any sutures or staples will be removed at the office during your follow-up visit. °7. DRAINS:  If you have drains in place, it is important to keep a list of the amount of drainage produced each day in your drains.  Before leaving the hospital, you should be instructed on drain care.  Call our office if you have any questions about your drains. °8. ACTIVITIES:  You may resume regular (light) daily activities beginning the next day--such as daily self-care, walking, climbing stairs--gradually increasing activities as tolerated.  You may have sexual intercourse when it is comfortable.  Refrain from any heavy lifting or straining until approved by your doctor. °a. You may drive when you are no longer taking prescription pain medication, you can comfortably wear a seatbelt, and you can safely maneuver your car and apply brakes. °b. RETURN TO WORK:  __________________________________________________________ °9. You should see your doctor in the office for a follow-up appointment approximately 3-5 days after your surgery.  Your doctor’s nurse will typically make your follow-up appointment when she calls you with your pathology report.  Expect your pathology report 2-3 business days after your surgery.  You may call to check if you do not hear from us after three days.   °10. OTHER INSTRUCTIONS: ______________________________________________________________________________________________ ____________________________________________________________________________________________ ° °WHEN TO CALL YOUR DOCTOR: °1. Fever over 101.0 °2. Nausea and/or vomiting °3. Extreme swelling or bruising °4. Continued bleeding from incision. °5. Increased pain, redness, or drainage from the   The clinic staff is available to answer your questions during regular business hours.  Please don't hesitate to call and ask to speak to one of the nurses for clinical  concerns.  If you have a medical emergency, go to the nearest emergency room or call 911.  A surgeon from [facility] is always on call at the hospital. [address] [place], [place], [place]  [address] ? [address], [place], [place]   [address] [phone] ? [phone] ? FAX [phone] Web site: [place] Instructions  Activity: Get plenty of rest for the remainder of the day. A responsible individual must stay with you for 24 hours following the procedure.  For the next 24 hours, DO NOT: -Drive a car -Paediatric nurse -Drink alcoholic beverages -Take any medication unless instructed by your physician -Make any legal decisions or sign important papers.  Meals: Start with liquid foods such as gelatin or soup. Progress to regular foods as tolerated. Avoid greasy, spicy, heavy foods. If nausea and/or vomiting occur, drink only clear liquids until the nausea and/or vomiting subsides. Call your physician if vomiting continues.  Special Instructions/Symptoms: Your throat may feel dry or sore from the anesthesia or the breathing tube placed in your throat during surgery. If this causes discomfort, gargle with warm salt water. The discomfort should disappear within 24 hours.  If you had a scopolamine patch placed behind your ear for the management of post- operative nausea and/or vomiting:  1. The medication in the patch is effective for 72 hours, after which it should be removed.  Wrap patch in a tissue and discard in the trash. Wash hands thoroughly with soap and water. 2. You may remove the patch earlier than 72 hours if you experience unpleasant side effects which may include dry mouth, dizziness or visual disturbances. 3. Avoid touching the patch. Wash your hands with soap and water after contact with the patch.     Regional Anesthesia Blocks  1. Numbness or the inability to move the "blocked" extremity may last from 3-48  hours after placement. The length of time depends on the medication injected and your individual response to the medication. If the numbness is not going away after 48 hours, call your surgeon.  2. The extremity that is blocked will need to be protected until the numbness is gone and the  Strength has returned. Because you cannot feel it, you will need to take extra care to avoid injury. Because it may be weak, you may have difficulty moving it or using it. You may not know what position it is in without looking at it while the block is in effect.  3. For blocks in the legs and feet, returning to weight bearing and walking needs to be done carefully. You will need to wait until the numbness is entirely gone and the strength has returned. You should be able to move your leg and foot normally before you try and bear weight or walk. You will need someone to be with you when you first try to ensure you do not fall and possibly risk injury.  4. Bruising and tenderness at the needle site are common side effects and will resolve in a few days.  5. Persistent numbness or new problems with movement should be communicated to the surgeon or the [place] [phone] [place] [phone]).  About my Jackson-Pratt Bulb Drain  What is a Jackson-Pratt bulb? A  Jackson-Pratt is a soft, round device used to collect drainage. It is connected to a long, thin drainage catheter, which is held in place by one or two small stiches near your surgical incision site. When the bulb is squeezed, it forms a vacuum, forcing the drainage to empty into the bulb.  Emptying the Jackson-Pratt bulb- To empty the bulb: 1. Release the plug on the top of the bulb. 2. Pour the bulb's contents into a measuring container which your nurse will provide. 3. Record the time emptied and amount of drainage. Empty the drain(s) as often as your     doctor or nurse recommends.  Date                  Time                     Amount (Drain 1)                 Amount (Drain 2)  _____________________________________________________________________  _____________________________________________________________________  _____________________________________________________________________  _____________________________________________________________________  _____________________________________________________________________  _____________________________________________________________________  _____________________________________________________________________  _____________________________________________________________________  Squeezing the Jackson-Pratt Bulb- To squeeze the bulb: 1. Make sure the plug at the top of the bulb is open. 2. Squeeze the bulb tightly in your fist. You will hear air squeezing from the bulb. 3. Replace the plug while the bulb is squeezed. 4. Use a safety pin to attach the bulb to your clothing. This will keep the catheter from     pulling at the bulb insertion site.  When to call your doctor- Call your doctor if:  Drain site becomes red, swollen or hot.  You have a fever greater than 101 degrees F.  There is oozing at the drain site.  Drain falls out (apply a guaze bandage over the drain hole and secure it with tape).  Drainage increases daily not related to activity patterns. (You will usually have more drainage when you are active than when you are resting.)  Drainage has a bad odor.

## 2020-02-25 ENCOUNTER — Encounter (HOSPITAL_BASED_OUTPATIENT_CLINIC_OR_DEPARTMENT_OTHER): Payer: Self-pay | Admitting: Surgery

## 2020-02-25 NOTE — Addendum Note (Signed)
Addendum  created 02/25/20 1314 by Tawni Millers, CRNA   Charge Capture section accepted

## 2020-02-25 NOTE — Discharge Summary (Signed)
Physician Discharge Summary  Patient ID: Sherica Paternostro MRN: [id number] DOB/AGE: 1961-02-21 59 y.o.  Admit date: 02/22/2020 Discharge date: 02/23/20 Admission Diagnoses: Invasive lobular carcinoma - right breast  Discharge Diagnoses: same Active Problems:   Invasive lobular carcinoma of breast in female [facility])   Discharged Condition: good  Hospital Course: [facility] - right mastectomy with sentinel lymph node biopsy on 02/22/20.  Patient stayed overnight for pain control.  No signs of bleeding.  Discharged per nursing on 02/23/20  Treatments: surgery: R mastectomy with SLNB  Discharge Exam: Blood pressure 121/83, pulse 80, temperature 98.2 F (36.8 C), resp. rate 18, height 5\' 8"  (1.727 m), weight (!) 113.6 kg, SpO2 95 %. General appearance: alert, cooperative and no distress Chest wall: right sided chest wall tenderness  Disposition: Discharge disposition: 01-Home or Self Care       Discharge Instructions    Call MD for:  persistant nausea and vomiting   Complete by: As directed    Call MD for:  redness, tenderness, or signs of infection (pain, swelling, redness, odor or green/yellow discharge around incision site)   Complete by: As directed    Call MD for:  severe uncontrolled pain   Complete by: As directed    Call MD for:  temperature >100.4   Complete by: As directed    Diet general   Complete by: As directed    Driving Restrictions   Complete by: As directed    Do not drive while taking pain medications   Increase activity slowly   Complete by: As directed    May shower / Bathe   Complete by: As directed      Allergies as of 02/23/2020      Reactions   Tylenol With Codeine #3 [acetaminophen-codeine] Nausea And Vomiting   Sulfa Antibiotics Rash      Medication List    TAKE these medications   acetaminophen 500 MG tablet Commonly known as: TYLENOL Take 500 mg by mouth every 6 (six) hours as needed.   amoxicillin 500 MG capsule Commonly known as:  AMOXIL Take 500 mg by mouth 3 (three) times daily.   exemestane 25 MG tablet Commonly known as: AROMASIN Take 1 tablet (25 mg total) by mouth daily after breakfast.   ibuprofen 800 MG tablet Commonly known as: ADVIL Take by mouth.   lisinopril 10 MG tablet Commonly known as: ZESTRIL Take 10 mg by mouth daily.   ondansetron 4 MG disintegrating tablet Commonly known as: Zofran ODT Take 1 tablet (4 mg total) by mouth every 8 (eight) hours as needed for nausea or vomiting.   oxyCODONE 5 MG immediate release tablet Commonly known as: Oxy IR/ROXICODONE Take 1 tablet (5 mg total) by mouth every 6 (six) hours as needed for severe pain.   venlafaxine XR 37.5 MG 24 hr capsule Commonly known as: EFFEXOR-XR Take 1 capsule (37.5 mg total) by mouth daily with breakfast.       Follow-up Information    Donnie Mesa, MD. Schedule an appointment as soon as possible for a visit in 1 week.   Specialty: General Surgery Contact information: [address] [place] [address] [phone]               Signed: Maia Petties 02/25/2020, 12:43 PM

## 2020-02-26 LAB — SURGICAL PATHOLOGY

## 2020-02-28 ENCOUNTER — Telehealth: Payer: Self-pay | Admitting: *Deleted

## 2020-02-28 ENCOUNTER — Encounter: Payer: Self-pay | Admitting: *Deleted

## 2020-02-28 NOTE — Telephone Encounter (Signed)
Ordered oncotype per Dr. Feng.  Faxed requisition to pathology.   

## 2020-02-29 ENCOUNTER — Telehealth: Payer: Self-pay | Admitting: *Deleted

## 2020-02-29 NOTE — Telephone Encounter (Signed)
error 

## 2020-03-03 ENCOUNTER — Other Ambulatory Visit (HOSPITAL_COMMUNITY): Payer: BC Managed Care – PPO

## 2020-03-06 ENCOUNTER — Other Ambulatory Visit (HOSPITAL_COMMUNITY): Payer: BC Managed Care – PPO

## 2020-03-07 ENCOUNTER — Encounter: Payer: Self-pay | Admitting: *Deleted

## 2020-03-07 ENCOUNTER — Telehealth: Payer: Self-pay | Admitting: *Deleted

## 2020-03-07 NOTE — Telephone Encounter (Signed)
Received oncotype results of 23/9%.  Patient is aware.  Appointment confirmed for follow up with Dr. Burr Medico for 8/26 at 1040.

## 2020-03-10 ENCOUNTER — Encounter (HOSPITAL_COMMUNITY): Payer: Self-pay | Admitting: Hematology

## 2020-03-19 NOTE — Progress Notes (Signed)
[place]   [phone] [phone]   Clinic Follow up Note   Patient Care Team: Arsenio Katz, NP as PCP - General (Nurse Practitioner) Rockwell Germany, RN as Oncology Nurse Navigator Mauro Kaufmann, RN as Oncology Nurse Navigator Donnie Mesa, MD as Consulting Physician (General Surgery) Truitt Merle, MD as Consulting Physician (Hematology) Kyung Rudd, MD as Consulting Physician (Radiation Oncology)  Date of Service:  03/20/2020  CHIEF COMPLAINT: F/u of right breast cancer  SUMMARY OF ONCOLOGIC HISTORY: Oncology History Overview Note  Cancer Staging Malignant neoplasm of upper-outer quadrant of right breast in female, estrogen receptor positive ([facility]) Staging form: Breast, AJCC 8th Edition - Clinical stage from 12/26/2019: Stage IB (cT2, cN0, cM0, G2, ER+, PR+, HER2-) - Unsigned    Malignant neoplasm of upper-outer quadrant of right breast in female, estrogen receptor positive ([place])  12/20/2019 Initial Biopsy   Diagnosis 1. Breast, right, needle core biopsy, 7:30 o'clock, 4 cmfn, ribbon clip - FIBROCYSTIC CHANGES. - THERE IS NO EVIDENCE OF MALIGNANCY. - SEE COMMENT. 2. Breast, right, needle core biopsy, 9 o'clock, 5 cmfn, coil clip - INVASIVE MAMMARY CARCINOMA. - SEE COMMENT. 3. Breast, right, needle core biopsy, outer, x clip - INVASIVE MAMMARY CARCINOMA. - ATYPICAL LOBULAR HYPERPLASIA WITH CALCIFICATIONS. - SEE COMMENT. Microscopic Comment 2. The carcinoma appears at least grade 2. The longest span of tumor is 1.2 cm. An E-Cadherin and a breast prognostic profile will be performed on part 2 and the results reported separately. Dr. Jeannie Done has reviewed part 2 and concurs with this interpretation. The results were called to [facility] on 12/21/2019. 3. The carcinoma in part 3 is morphologically dissimilar from that in part 2. The longest span of tumor is 0.1 cm. An E-Cadherin and a breast prognostic profile will also  be performed on part 3 and the results reported separately. (JBK:ah 12/21/19)   12/20/2019 Receptors her2   2. PROGNOSTIC INDICATORS Results: IMMUNOHISTOCHEMICAL AND MORPHOMETRIC ANALYSIS PERFORMED MANUALLY The tumor cells are NEGATIVE for Her2 (1+). Estrogen Receptor: 95%, POSITIVE, STRONG STAINING INTENSITY Progesterone Receptor: 95%, POSITIVE, STRONG STAINING INTENSITY Proliferation Marker Ki67: 15%   3. Prognostic indicators:  HER2 NEGATIVE  ER: 95% POSITIVE STRONG STAINING PR 90% POSITIVE STRONG STAINING  Proliferation Marker Ki67: 20%   12/20/2019 Mammogram   Diagnostic Mammogram  IMPRESSION    12/25/2019 Initial Diagnosis   Malignant neoplasm of upper-outer quadrant of right breast in female, estrogen receptor positive ([facility])   12/29/2019 Breast MRI   IMPRESSION: 1. 2.5 cm spiculated mass in the upper outer right breast at middle depth consistent with the patient's biopsy-proven malignancy (coil clip). 2. Additional 9 mm enhancing mass located approximately 3 cm inferior to the index lesion. This is seen adjacent to a biopsy tract in the central right breast and may represents the additional biopsied focus of malignancy. The associated X-shaped clip was displaced 4 cm medially at the time of biopsy. 3. No MRI evidence of malignancy on the left. 4. No suspicious lymphadenopathy.   01/02/2020 Genetic Testing   Negative genetic testing:  No pathogenic variants detected on the [facility] Breast Cancer STAT panel or the Common Hereditary Cancers panel. The report dates are 01/02/2020 and 01/08/2020, respectively.  The Breast Cancer STAT Panel offered by [facility] includes sequencing and deletion/duplication analysis for the following 9 genes:  ATM, BRCA1, BRCA2, CDH1, CHEK2, PALB2, PTEN, STK11 and TP53. The Common Hereditary Cancers Panel offered by [facility] includes sequencing and/or deletion duplication testing of the following  47 genes: APC, ATM, AXIN2, BARD1, BMPR1A, BRCA1, BRCA2,  BRIP1, CDH1, CDK4, CDKN2A (p14ARF), CDKN2A (p16INK4a), CHEK2, CTNNA1, DICER1, EPCAM (Deletion/duplication testing only), GREM1 (promoter region deletion/duplication testing only), KIT, MEN1, MLH1, MSH2, MSH3, MSH6, MUTYH, NBN, NF1, NTHL1, PALB2, PDGFRA, PMS2, POLD1, POLE, PTEN, RAD50, RAD51C, RAD51D, SDHB, SDHC, SDHD, SMAD4, SMARCA4. STK11, TP53, TSC1, TSC2, and VHL.  The following genes were evaluated for sequence changes only: SDHA and HOXB13 c.251G>A variant only.   01/16/2020 Pathology Results   Diagnosis Breast, right, needle core biopsy, mid to post 1/3 out mid below known cancer - INVASIVE AND IN SITU LOBULAR CARCINOMA. - SEE MICROSCOPIC DESCRIPTION. Microscopic Comment The greatest dimension in a single core is 0.8 cm.   01/22/2020 -  Neo-Adjuvant Anti-estrogen oral therapy   Exemestane $RemoveBef'25mg'yxUYxtYDgt$  once daily starting in 01/22/20   02/22/2020 Surgery   RIGHT MASTECTOMY WITH SENTINEL LYMPH NODE BIOPSY   02/22/2020 Pathology Results   FINAL MICROSCOPIC DIAGNOSIS:   A. BREAST, RIGHT, MASTECTOMY:  - Invasive lobular carcinoma, grade 2, spanning 4.1 cm.  - Lobular carcinoma in situ.  - Anterior margin is focally positive for invasive carcinoma.  - Perineural invasion.  - Lymphovascular invasion.  - See oncology table.   B. LYMPH NODE, RIGHT AXILLARY #1, SENTINEL, BIOPSY:  - One of one lymph nodes negative for carcinoma (0/1).   C. SKIN, RIGHT MASTECTOMY ADDITIONAL INFERIOR FLAP, EXCISION:  - Focal invasive lobular carcinoma, 2 mm, associated with lymphovascular  invasion.  - Margin is negative for carcinoma.    02/22/2020 Oncotype testing   RS 23  Distant risk of recurrence 9% Less than 1% benefit of chemotherapy      CURRENT THERAPY:  -Exemestane $RemoveBefo'25mg'NxeJaFDYHEy$  once daily starting 01/22/20  INTERVAL HISTORY:  Ezri Fanguy is here for a follow up and treatment. She presents to the clinic alone. She notes her surgery went well. Her chest tube was removed 2 weeks ago. She last saw Dr Prince Solian  3 days ago and had wound drained. She will consider reconstruction surgery later if still interested. She notes her last period was age 59. She is on Exemestane and tolerating well with hot flashes. She is on low dose Effexor and tolerating well. She noted she quit smoking in 02/2020.  She noticed today she has bladder pain and low urine output even though she is drinking adequately.    REVIEW OF SYSTEMS:   Constitutional: Denies fevers, chills or abnormal weight loss Eyes: Denies blurriness of vision Ears, nose, mouth, throat, and face: Denies mucositis or sore throat Respiratory: Denies cough, dyspnea or wheezes Cardiovascular: Denies palpitation, chest discomfort or lower extremity swelling Gastrointestinal:  Denies nausea, heartburn or change in bowel habits UA: (+) bladder pain  Skin: Denies abnormal skin rashes Lymphatics: Denies new lymphadenopathy or easy bruising Neurological:Denies numbness, tingling or new weaknesses Behavioral/Psych: Mood is stable, no new changes  All other systems were reviewed with the patient and are negative.  MEDICAL HISTORY:  Past Medical History:  Diagnosis Date  . Anxiety   . Cancer ([place]) 12/2019   right breast invasive lobular cancer  . Depression   . Family history of colon cancer   . Family history of melanoma   . Family history of throat cancer   . Family history of uterine cancer   . Hypertension     SURGICAL HISTORY: Past Surgical History:  Procedure Laterality Date  . c sections      x3  . CESAREAN SECTION    . KNEE ARTHROSCOPY Left   .  MASTECTOMY W/ SENTINEL NODE BIOPSY Right 02/22/2020   Procedure: RIGHT MASTECTOMY WITH SENTINEL LYMPH NODE BIOPSY;  Surgeon: Donnie Mesa, MD;  Location: [facility];  Service: General;  Laterality: Right;  PEC BLOCK  . TONSILLECTOMY      I have reviewed the social history and family history with the patient and they are unchanged from previous note.  ALLERGIES:  is allergic to  tylenol with codeine #3 [acetaminophen-codeine] and sulfa antibiotics.  MEDICATIONS:  Current Outpatient Medications  Medication Sig Dispense Refill  . acetaminophen (TYLENOL) 500 MG tablet Take 500 mg by mouth every 6 (six) hours as needed.    Marland Kitchen amoxicillin (AMOXIL) 500 MG capsule Take 500 mg by mouth 3 (three) times daily.    Marland Kitchen exemestane (AROMASIN) 25 MG tablet Take 1 tablet (25 mg total) by mouth daily after breakfast. 90 tablet 1  . ibuprofen (ADVIL) 800 MG tablet Take by mouth.    Marland Kitchen lisinopril (ZESTRIL) 10 MG tablet Take 10 mg by mouth daily.    . ondansetron (ZOFRAN ODT) 4 MG disintegrating tablet Take 1 tablet (4 mg total) by mouth every 8 (eight) hours as needed for nausea or vomiting. 20 tablet 0  . oxyCODONE (OXY IR/ROXICODONE) 5 MG immediate release tablet Take 1 tablet (5 mg total) by mouth every 6 (six) hours as needed for severe pain. 20 tablet 0  . venlafaxine XR (EFFEXOR-XR) 75 MG 24 hr capsule Take 1 capsule (75 mg total) by mouth daily with breakfast. 30 capsule 3   No current facility-administered medications for this visit.    PHYSICAL EXAMINATION: ECOG PERFORMANCE STATUS: 0 - Asymptomatic  Vitals:   03/20/20 1057  BP: 136/87  Pulse: 90  Resp: 18  Temp: (!) 97.4 F (36.3 C)  SpO2: 94%   Filed Weights   03/20/20 1057  Weight: 255 lb 11.2 oz (116 kg)    GENERAL:alert, no distress and comfortable SKIN: skin color, texture, turgor are normal, no rashes or significant lesions EYES: normal, Conjunctiva are pink and non-injected, sclera clear  NECK: supple, thyroid normal size, non-tender, without nodularity LYMPH:  no palpable lymphadenopathy in the cervical, axillary  LUNGS: clear to auscultation and percussion with normal breathing effort HEART: regular rate & rhythm and no murmurs and no lower extremity edema ABDOMEN:abdomen soft, non-tender and normal bowel sounds Musculoskeletal:no cyanosis of digits and no clubbing  NEURO: alert & oriented x 3 with  fluent speech, no focal motor/sensory deficits BREAST: S/p right mastectomy: Surgical incision healing well. No palpable mass, nodules or adenopathy bilaterally. Breast exam benign.   LABORATORY DATA:  I have reviewed the data as listed CBC Latest Ref Rng & Units 12/26/2019  WBC 4.0 - 10.5 K/uL 8.0  Hemoglobin 12.0 - 15.0 g/dL 15.1(H)  Hematocrit 36 - 46 % 44.8  Platelets 150 - 400 K/uL 272     CMP Latest Ref Rng & Units 12/26/2019  Glucose 70 - 99 mg/dL 100(H)  BUN 6 - 20 mg/dL 17  Creatinine 0.44 - 1.00 mg/dL 0.94  Sodium 135 - 145 mmol/L 142  Potassium 3.5 - 5.1 mmol/L 4.2  Chloride 98 - 111 mmol/L 106  CO2 22 - 32 mmol/L 26  Calcium 8.9 - 10.3 mg/dL 10.4(H)  Total Protein 6.5 - 8.1 g/dL 7.6  Total Bilirubin 0.3 - 1.2 mg/dL 0.6  Alkaline Phos 38 - 126 U/L 67  AST 15 - 41 U/L 30  ALT 0 - 44 U/L 53(H)      RADIOGRAPHIC STUDIES: I have  personally reviewed the radiological images as listed and agreed with the findings in the report. No results found.   ASSESSMENT & PLAN:  Cynthia Shaw is a 59 y.o. female with    1.Malignant neoplasm of upper-outer quadrant of right breast, invasive lobular carcinoma, stageIB,c(T2N0M0), ER/PR+/HER2-,GradeII, Oncotype RS 23 -She was recently diagnosed in 11/2019. She has a 2.6cm mass at 9:00 position of right breast and another small mass adjacent, both with invasive lobular carcinoma.  -Her Breast MRI from 12/29/19 shows her known 2.6cm mass and another 75mm mass of right breast. The 75mm mass was biopsied on 01/16/20 which showed invasive and IN SITU lobular carcinoma. She overall has 3 right breast lesions of multifocal cancer.  -Given her multifocal disease, she underwent right mastectomy with Dr. Georgette Dover on 02/22/20. We discussed her pathology shows her 4.1cm tumor was completely resected with clear margins and LN negative. I reviewed her cancer's high risk features of grade 2 disease with skin involvement and Perineural invasion.  -I also  discussed her Oncotype results which showed Recurrence score of 23 with distant recurrence risk of 9%.  Due to the low risk disease, I do not recommend adjuvant chemotherapy -I discussed with negative LN after mastectomy, she probably will not need adjuvant radiation.  I will review her pathology with Dr Lisbeth Renshaw to see if he agrees.  -She started Exemestane before her breast surgery on 01/22/20. She has tolerated well with hot flashes. Plan to continue for 10 years given her high risk features and lobular carcinoma.  -We also discussed the breast cancer surveillance. She will continue annual screening mammogram, self exams, and a routine office visit with lab and exam with [place]. Her next Left Mammogram is 11/2020 Given lobular disease I discussed the option of additional screening with annual breast MRIs. She is interested, will start in 2022.  -Will proceed with Survivorship clinic with NP Lacie in 3 months and f/u with me in 6 months.    2. H/o Cervical Cancer, On situ carcinoma, Genetic Testing (negative) -Dx 1990 and treated at [facility] in [place], [place]. Pap smears have been benign since.  -She has family history of Endometrial cancer, ADH, and colon cancer. -She has negative genetic testing:  No pathogenic variants detected on the [facility] Breast Cancer STAT panel. The report date is 01/02/2020.   3. Anxiety/Depression, Hot flashes -She notes she has been anxious and depressed about her recent cancer diagnosis.  -I offered her to chance to speak with her SW for counseling. She declined for now.  -She was on Lexapro before which caused weight gain. She is now interested in managing her mood with medication. I previously started her on low dose Effexor (01/22/20).  -She has hot flashes secondary to Exemestane. Mostly tolerable. Will increase Effexor to $RemoveBe'75mg'QFHGcnzIZ$  (03/21/20).  -She has symptoms of bladder pain and lower urine out put which she thinks is UTI. Will do UA and culture today to  further evaluate. If she has UTI, I will call in Cipro.   4. Arthritis, Weight gain -She has chronic left hip and back pain since 2019. This resolved after shots to area and weight loss.  -She did gain weight during COVID in 2020 and has pain has recurred in the past 6 months.  -Will watch on Exemestane.  -She plans to restart weight watchers after she heals from breast surgery which she has had success with.   5. Smoking Cessation  -She does smoke less currently, a few cigarettes a day.She was bale  to quit smoking in 02/2020.    PLAN: -UA and culture today  -I refilled Exemestane and Effexor today, will increase Effexor to 75 mg daily -Continue Exemestane once daily -Survivorship clinic with NP Lacie in 3 months  -Lab and F/u with me in 6 months    No problem-specific Assessment & Plan notes found for this encounter.   Orders Placed This Encounter  Procedures  . Urine Culture    Standing Status:   Future    Number of Occurrences:   1    Standing Expiration Date:   03/20/2021  . Urinalysis, Complete w Microscopic    Standing Status:   Future    Number of Occurrences:   1    Standing Expiration Date:   03/20/2021   All questions were answered. The patient knows to call the clinic with any problems, questions or concerns. No barriers to learning was detected. The total time spent in the appointment was 30 minutes.     Truitt Merle, MD 03/20/2020   I, Joslyn Devon, am acting as scribe for Truitt Merle, MD.   I have reviewed the above documentation for accuracy and completeness, and I agree with the above.

## 2020-03-20 ENCOUNTER — Inpatient Hospital Stay: Payer: BC Managed Care – PPO

## 2020-03-20 ENCOUNTER — Telehealth: Payer: Self-pay | Admitting: Hematology

## 2020-03-20 ENCOUNTER — Encounter: Payer: Self-pay | Admitting: Hematology

## 2020-03-20 ENCOUNTER — Inpatient Hospital Stay: Payer: BC Managed Care – PPO | Attending: Hematology | Admitting: Hematology

## 2020-03-20 ENCOUNTER — Other Ambulatory Visit: Payer: Self-pay

## 2020-03-20 VITALS — BP 136/87 | HR 90 | Temp 97.4°F | Resp 18 | Ht 68.0 in | Wt 255.7 lb

## 2020-03-20 DIAGNOSIS — M1990 Unspecified osteoarthritis, unspecified site: Secondary | ICD-10-CM | POA: Insufficient documentation

## 2020-03-20 DIAGNOSIS — R300 Dysuria: Secondary | ICD-10-CM

## 2020-03-20 DIAGNOSIS — R635 Abnormal weight gain: Secondary | ICD-10-CM | POA: Diagnosis not present

## 2020-03-20 DIAGNOSIS — Z79899 Other long term (current) drug therapy: Secondary | ICD-10-CM | POA: Insufficient documentation

## 2020-03-20 DIAGNOSIS — F329 Major depressive disorder, single episode, unspecified: Secondary | ICD-10-CM | POA: Diagnosis not present

## 2020-03-20 DIAGNOSIS — I10 Essential (primary) hypertension: Secondary | ICD-10-CM | POA: Insufficient documentation

## 2020-03-20 DIAGNOSIS — Z800 Family history of malignant neoplasm of digestive organs: Secondary | ICD-10-CM | POA: Insufficient documentation

## 2020-03-20 DIAGNOSIS — C50411 Malignant neoplasm of upper-outer quadrant of right female breast: Secondary | ICD-10-CM | POA: Diagnosis present

## 2020-03-20 DIAGNOSIS — Z808 Family history of malignant neoplasm of other organs or systems: Secondary | ICD-10-CM | POA: Diagnosis not present

## 2020-03-20 DIAGNOSIS — Z170 Estrogen receptor positive status [ER+]: Secondary | ICD-10-CM | POA: Diagnosis not present

## 2020-03-20 DIAGNOSIS — Z9013 Acquired absence of bilateral breasts and nipples: Secondary | ICD-10-CM | POA: Diagnosis not present

## 2020-03-20 DIAGNOSIS — Z79811 Long term (current) use of aromatase inhibitors: Secondary | ICD-10-CM | POA: Diagnosis not present

## 2020-03-20 DIAGNOSIS — F17210 Nicotine dependence, cigarettes, uncomplicated: Secondary | ICD-10-CM | POA: Diagnosis not present

## 2020-03-20 DIAGNOSIS — Z86001 Personal history of in-situ neoplasm of cervix uteri: Secondary | ICD-10-CM | POA: Diagnosis not present

## 2020-03-20 DIAGNOSIS — N951 Menopausal and female climacteric states: Secondary | ICD-10-CM | POA: Diagnosis not present

## 2020-03-20 DIAGNOSIS — Z8049 Family history of malignant neoplasm of other genital organs: Secondary | ICD-10-CM | POA: Insufficient documentation

## 2020-03-20 LAB — URINALYSIS, COMPLETE (UACMP) WITH MICROSCOPIC
Bacteria, UA: NONE SEEN
Bilirubin Urine: NEGATIVE
Glucose, UA: NEGATIVE mg/dL
Ketones, ur: NEGATIVE mg/dL
Leukocytes,Ua: NEGATIVE
Nitrite: NEGATIVE
Protein, ur: NEGATIVE mg/dL
Specific Gravity, Urine: 1.011 (ref 1.005–1.030)
pH: 6 (ref 5.0–8.0)

## 2020-03-20 MED ORDER — EXEMESTANE 25 MG PO TABS: 25.0000 mg | ORAL_TABLET | Freq: Every day | ORAL | 1 refills | Status: DC

## 2020-03-20 MED ORDER — VENLAFAXINE HCL ER 75 MG PO CP24
75.0000 mg | ORAL_CAPSULE | Freq: Every day | ORAL | 3 refills | Status: DC
Start: 2020-03-20 — End: 2020-07-29

## 2020-03-20 NOTE — Telephone Encounter (Signed)
Scheduled per los. Gave avs and calendar  

## 2020-03-21 ENCOUNTER — Encounter: Payer: Self-pay | Admitting: *Deleted

## 2020-03-21 LAB — URINE CULTURE: Culture: NO GROWTH

## 2020-03-24 ENCOUNTER — Encounter: Payer: Self-pay | Admitting: *Deleted

## 2020-04-02 ENCOUNTER — Telehealth: Payer: Self-pay | Admitting: *Deleted

## 2020-04-02 NOTE — Telephone Encounter (Signed)
Called pt to discuss post mastectomy xrt is not recommended. Received verbal understanding Pt informed she is having increased swelling and some pain to right breast that has "increase since the last time Dr. Georgette Dover drained it". Msg sent to Dr. Georgette Dover regarding this.

## 2020-06-10 ENCOUNTER — Inpatient Hospital Stay: Payer: BC Managed Care – PPO | Attending: Hematology | Admitting: Nurse Practitioner

## 2020-07-26 ENCOUNTER — Other Ambulatory Visit: Payer: Self-pay | Admitting: Hematology

## 2020-09-04 ENCOUNTER — Other Ambulatory Visit: Payer: Self-pay | Admitting: Hematology

## 2020-09-15 NOTE — Progress Notes (Incomplete)
[place]   [phone] [phone]   Clinic Follow up Note   Patient Care Team: Arsenio Katz, NP as PCP - General (Nurse Practitioner) Rockwell Germany, RN as Oncology Nurse Navigator Mauro Kaufmann, RN as Oncology Nurse Navigator Donnie Mesa, MD as Consulting Physician (General Surgery) Truitt Merle, MD as Consulting Physician (Hematology) Kyung Rudd, MD as Consulting Physician (Radiation Oncology) Alla Feeling, NP as Nurse Practitioner (Nurse Practitioner)  Date of Service:  09/15/2020  CHIEF COMPLAINT:  F/u of right breast cancer  SUMMARY OF ONCOLOGIC HISTORY: Oncology History Overview Note  Cancer Staging Malignant neoplasm of upper-outer quadrant of right breast in female, estrogen receptor positive ([place]) Staging form: Breast, AJCC 8th Edition - Clinical stage from 12/26/2019: Stage IB (cT2, cN0, cM0, G2, ER+, PR+, HER2-) - Unsigned - Pathologic stage from 02/22/2020: Stage IA (pT2, pN0, cM0, G2, ER+, PR+, HER2-) - Signed by Alla Feeling, NP on 06/09/2020    Malignant neoplasm of upper-outer quadrant of right breast in female, estrogen receptor positive ([place])  12/20/2019 Initial Biopsy   Diagnosis 1. Breast, right, needle core biopsy, 7:30 o'clock, 4 cmfn, ribbon clip - FIBROCYSTIC CHANGES. - THERE IS NO EVIDENCE OF MALIGNANCY. - SEE COMMENT. 2. Breast, right, needle core biopsy, 9 o'clock, 5 cmfn, coil clip - INVASIVE MAMMARY CARCINOMA. - SEE COMMENT. 3. Breast, right, needle core biopsy, outer, x clip - INVASIVE MAMMARY CARCINOMA. - ATYPICAL LOBULAR HYPERPLASIA WITH CALCIFICATIONS. - SEE COMMENT. Microscopic Comment 2. The carcinoma appears at least grade 2. The longest span of tumor is 1.2 cm. An E-Cadherin and a breast prognostic profile will be performed on part 2 and the results reported separately. Dr. Jeannie Done has reviewed part 2 and concurs with this interpretation. The results were called to [facility] on 12/21/2019. 3. The carcinoma in part 3 is morphologically dissimilar from that in part 2. The longest span of tumor is 0.1 cm. An E-Cadherin and a breast prognostic profile will also be performed on part 3 and the results reported separately. (JBK:ah 12/21/19)   12/20/2019 Receptors her2   2. PROGNOSTIC INDICATORS Results: IMMUNOHISTOCHEMICAL AND MORPHOMETRIC ANALYSIS PERFORMED MANUALLY The tumor cells are NEGATIVE for Her2 (1+). Estrogen Receptor: 95%, POSITIVE, STRONG STAINING INTENSITY Progesterone Receptor: 95%, POSITIVE, STRONG STAINING INTENSITY Proliferation Marker Ki67: 15%   3. Prognostic indicators:  HER2 NEGATIVE  ER: 95% POSITIVE STRONG STAINING PR 90% POSITIVE STRONG STAINING  Proliferation Marker Ki67: 20%   12/20/2019 Mammogram   Diagnostic Mammogram  IMPRESSION    12/25/2019 Initial Diagnosis   Malignant neoplasm of upper-outer quadrant of right breast in female, estrogen receptor positive ([place])   12/29/2019 Breast MRI   IMPRESSION: 1. 2.5 cm spiculated mass in the upper outer right breast at middle depth consistent with the patient's biopsy-proven malignancy (coil clip). 2. Additional 9 mm enhancing mass located approximately 3 cm inferior to the index lesion. This is seen adjacent to a biopsy tract in the central right breast and may represents the additional biopsied focus of malignancy. The associated X-shaped clip was displaced 4 cm medially at the time of biopsy. 3. No MRI evidence of malignancy on the left. 4. No suspicious lymphadenopathy.   01/02/2020 Genetic Testing   Negative genetic testing:  No pathogenic variants detected on the [facility] Breast Cancer STAT panel or the Common Hereditary Cancers panel. The report dates are 01/02/2020 and 01/08/2020, respectively.  The Breast Cancer STAT Panel offered by [facility] includes sequencing and deletion/duplication analysis  for the following 9 genes:  ATM, BRCA1, BRCA2, CDH1, CHEK2, PALB2, PTEN, STK11  and TP53. The Common Hereditary Cancers Panel offered by [facility] includes sequencing and/or deletion duplication testing of the following 47 genes: APC, ATM, AXIN2, BARD1, BMPR1A, BRCA1, BRCA2, BRIP1, CDH1, CDK4, CDKN2A (p14ARF), CDKN2A (p16INK4a), CHEK2, CTNNA1, DICER1, EPCAM (Deletion/duplication testing only), GREM1 (promoter region deletion/duplication testing only), KIT, MEN1, MLH1, MSH2, MSH3, MSH6, MUTYH, NBN, NF1, NTHL1, PALB2, PDGFRA, PMS2, POLD1, POLE, PTEN, RAD50, RAD51C, RAD51D, SDHB, SDHC, SDHD, SMAD4, SMARCA4. STK11, TP53, TSC1, TSC2, and VHL.  The following genes were evaluated for sequence changes only: SDHA and HOXB13 c.251G>A variant only.   01/16/2020 Pathology Results   Diagnosis Breast, right, needle core biopsy, mid to post 1/3 out mid below known cancer - INVASIVE AND IN SITU LOBULAR CARCINOMA. - SEE MICROSCOPIC DESCRIPTION. Microscopic Comment The greatest dimension in a single core is 0.8 cm.   01/22/2020 -  Neo-Adjuvant Anti-estrogen oral therapy   Exemestane 67m once daily starting in 01/22/20   02/22/2020 Surgery   RIGHT MASTECTOMY WITH SENTINEL LYMPH NODE BIOPSY   02/22/2020 Pathology Results   FINAL MICROSCOPIC DIAGNOSIS:   A. BREAST, RIGHT, MASTECTOMY:  - Invasive lobular carcinoma, grade 2, spanning 4.1 cm.  - Lobular carcinoma in situ.  - Anterior margin is focally positive for invasive carcinoma.  - Perineural invasion.  - Lymphovascular invasion.  - See oncology table.   B. LYMPH NODE, RIGHT AXILLARY #1, SENTINEL, BIOPSY:  - One of one lymph nodes negative for carcinoma (0/1).   C. SKIN, RIGHT MASTECTOMY ADDITIONAL INFERIOR FLAP, EXCISION:  - Focal invasive lobular carcinoma, 2 mm, associated with lymphovascular  invasion.  - Margin is negative for carcinoma.    02/22/2020 Oncotype testing   RS 23  Distant risk of recurrence 9% Less than 1% benefit of chemotherapy   02/22/2020 Cancer Staging   Staging form: Breast, AJCC 8th Edition - Pathologic  stage from 02/22/2020: Stage IA (pT2, pN0, cM0, G2, ER+, PR+, HER2-) - Signed by BAlla Feeling NP on 06/09/2020   08/08/2020 Survivorship   SCP mailed to patient 08/08/2020      CURRENT THERAPY:  -Exemestane 248monce daily starting 01/22/20  INTERVAL HISTORY: *** Cynthia Shaw here for a follow up of right breast cancer. She was last seen by me 6 months ago. She presents to the clinic alone.     REVIEW OF SYSTEMS:  *** Constitutional: Denies fevers, chills or abnormal weight loss Eyes: Denies blurriness of vision Ears, nose, mouth, throat, and face: Denies mucositis or sore throat Respiratory: Denies cough, dyspnea or wheezes Cardiovascular: Denies palpitation, chest discomfort or lower extremity swelling Gastrointestinal:  Denies nausea, heartburn or change in bowel habits Skin: Denies abnormal skin rashes Lymphatics: Denies new lymphadenopathy or easy bruising Neurological:Denies numbness, tingling or new weaknesses Behavioral/Psych: Mood is stable, no new changes  All other systems were reviewed with the patient and are negative.  MEDICAL HISTORY:  Past Medical History:  Diagnosis Date  . Anxiety   . Cancer ([facility]2021   right breast invasive lobular cancer  . Depression   . Family history of colon cancer   . Family history of melanoma   . Family history of throat cancer   . Family history of uterine cancer   . Hypertension     SURGICAL HISTORY: Past Surgical History:  Procedure Laterality Date  . c sections      x3  . CESAREAN SECTION    . KNEE ARTHROSCOPY Left   .  MASTECTOMY W/ SENTINEL NODE BIOPSY Right 02/22/2020   Procedure: RIGHT MASTECTOMY WITH SENTINEL LYMPH NODE BIOPSY;  Surgeon: Donnie Mesa, MD;  Location: [facility];  Service: General;  Laterality: Right;  PEC BLOCK  . TONSILLECTOMY      I have reviewed the social history and family history with the patient and they are unchanged from previous note.  ALLERGIES:  is  allergic to tylenol with codeine #3 [acetaminophen-codeine] and sulfa antibiotics.  MEDICATIONS:  Current Outpatient Medications  Medication Sig Dispense Refill  . acetaminophen (TYLENOL) 500 MG tablet Take 500 mg by mouth every 6 (six) hours as needed.    Marland Kitchen amoxicillin (AMOXIL) 500 MG capsule Take 500 mg by mouth 3 (three) times daily.    Marland Kitchen exemestane (AROMASIN) 25 MG tablet TAKE 1 TABLET BY MOUTH DAILY AFTER BREAKFAST 90 tablet 1  . ibuprofen (ADVIL) 800 MG tablet Take by mouth.    Marland Kitchen lisinopril (ZESTRIL) 10 MG tablet Take 10 mg by mouth daily.    . ondansetron (ZOFRAN ODT) 4 MG disintegrating tablet Take 1 tablet (4 mg total) by mouth every 8 (eight) hours as needed for nausea or vomiting. 20 tablet 0  . oxyCODONE (OXY IR/ROXICODONE) 5 MG immediate release tablet Take 1 tablet (5 mg total) by mouth every 6 (six) hours as needed for severe pain. 20 tablet 0  . venlafaxine XR (EFFEXOR-XR) 75 MG 24 hr capsule TAKE ONE CAPSULE BY MOUTH DAILY WITH BREAKFAST 30 capsule 3   No current facility-administered medications for this visit.    PHYSICAL EXAMINATION: ECOG PERFORMANCE STATUS: {CHL ONC ECOG PS:[id number]}  There were no vitals filed for this visit. There were no vitals filed for this visit. *** GENERAL:alert, no distress and comfortable SKIN: skin color, texture, turgor are normal, no rashes or significant lesions EYES: normal, Conjunctiva are pink and non-injected, sclera clear {OROPHARYNX:no exudate, no erythema and lips, buccal mucosa, and tongue normal}  NECK: supple, thyroid normal size, non-tender, without nodularity LYMPH:  no palpable lymphadenopathy in the cervical, axillary {or inguinal} LUNGS: clear to auscultation and percussion with normal breathing effort HEART: regular rate & rhythm and no murmurs and no lower extremity edema ABDOMEN:abdomen soft, non-tender and normal bowel sounds Musculoskeletal:no cyanosis of digits and no clubbing  NEURO: alert & oriented x 3 with  fluent speech, no focal motor/sensory deficits  LABORATORY DATA:  I have reviewed the data as listed CBC Latest Ref Rng & Units 12/26/2019  WBC 4.0 - 10.5 K/uL 8.0  Hemoglobin 12.0 - 15.0 g/dL 15.1(H)  Hematocrit 36.0 - 46.0 % 44.8  Platelets 150 - 400 K/uL 272     CMP Latest Ref Rng & Units 12/26/2019  Glucose 70 - 99 mg/dL 100(H)  BUN 6 - 20 mg/dL 17  Creatinine 0.44 - 1.00 mg/dL 0.94  Sodium 135 - 145 mmol/L 142  Potassium 3.5 - 5.1 mmol/L 4.2  Chloride 98 - 111 mmol/L 106  CO2 22 - 32 mmol/L 26  Calcium 8.9 - 10.3 mg/dL 10.4(H)  Total Protein 6.5 - 8.1 g/dL 7.6  Total Bilirubin 0.3 - 1.2 mg/dL 0.6  Alkaline Phos 38 - 126 U/L 67  AST 15 - 41 U/L 30  ALT 0 - 44 U/L 53(H)      RADIOGRAPHIC STUDIES: I have personally reviewed the radiological images as listed and agreed with the findings in the report. No results found.   ASSESSMENT & PLAN:  Cynthia Shaw is a 60 y.o. female with   1.Malignant neoplasm of upper-outer  quadrant of right breast,invasive lobular carcinoma, stageIB,c(T2N0M0), ER/PR+/HER2-,GradeII, Oncotype RS 23 -She was recently diagnosed in 11/2019. She has a2.6cm mass at 9:00 position of right breast and another small mass adjacent, both with invasive lobular carcinoma. -Her Breast MRI from 12/29/19 shows her known 2.6cm mass and another 64mm mass of right breast. The 53mm mass was biopsied on 01/16/20 which showed invasive and IN SITU lobular carcinoma.She overall has 3 right breast lesions of multifocal cancer.  -Given her multifocal disease, she underwent right mastectomy with Dr. Georgette Dover on 02/22/20. We discussed her pathology shows her 4.1cm tumor was completely resected with clear margins and LN negative. I reviewed her cancer's high risk features of grade 2 disease with skin involvement and Perineural invasion.  -I also discussed her Oncotype results which showed Recurrence score of 23 with distant recurrence risk of 9%.  Due to the low risk disease, I do  not recommend adjuvant chemotherapy -I discussed with negative LN after mastectomy, she probably will not need adjuvant radiation.  I will review her pathology with Dr Lisbeth Renshaw to see if he agrees.  -She started Exemestane before her breast surgery on 01/22/20. She has tolerated well with hot flashes. Plan to continue for 10 years given her high risk features and lobular carcinoma.  -We also discussed the breast cancer surveillance. She will continue annual screening mammogram, self exams, and a routine office visit with lab and exam with [place]. Her next Left Mammogram is 11/2020 Given lobular disease I discussed the option of additional screening with annual breast MRIs. She is interested, will start in 2022.  -Will proceed with Survivorship clinic with NP Lacie in 3 months and f/u with me in 6 months.    2. H/o Cervical Cancer, On situ carcinoma, Genetic Testing (negative) -Dx 1990 and treated at [facility] in [place], [place]. Pap smears have been benign since.  -She has family history of Endometrial cancer, ADH, and colon cancer. -She has negative genetic testing: No pathogenic variants detected on the [facility] Breast Cancer STAT panel. The report date is 01/02/2020.   3. Anxiety/Depression, Hot flashes -She notes she has been anxious and depressed about her recent cancer diagnosis.  -I offered her to chance to speak with her SW for counseling. She declined for now.  -She was on Lexapro before which caused weight gain. Sheis now interested in managing her mood with medication. I previously started her on low dose Effexor (01/22/20). -She has hot flashes secondary to Exemestane. Mostly tolerable. Will increase Effexor to $RemoveBe'75mg'[id number]$  (03/21/20).  -She has symptoms of bladder pain and lower urine out put which she thinks is UTI. Will do UA and culture today to further evaluate. If she has UTI, I will call in Cipro.   4. Arthritis, Weight gain -She has chronic left hip and back pain since 2019. This  resolved after shots to area and weight loss.  -She did gain weight during COVID in 2020 and has pain has recurred in the past 6 months. -Will watch on Exemestane. -She plans to restart weight watchers after she heals from breast surgery which she has had success with.   5. Smoking Cessation  -She does smoke less currently, a few cigarettes a day.She was bale to quit smoking in 02/2020.    PLAN: -UA and culture today  -I refilled Exemestane and Effexor today, will increase Effexor to 75 mg daily -Continue Exemestane once daily -Survivorship clinic with NP Lacie in 3 months  -Lab and F/u with me in 6 months  No problem-specific Assessment & Plan notes found for this encounter.   No orders of the defined types were placed in this encounter.  All questions were answered. The patient knows to call the clinic with any problems, questions or concerns. No barriers to learning was detected. The total time spent in the appointment was {CHL ONC TIME VISIT - BUYZJ:[id number]}.     Joslyn Devon 09/15/2020   Oneal Deputy, am acting as scribe for Truitt Merle, MD.   {Add scribe attestation statement}

## 2020-09-16 ENCOUNTER — Other Ambulatory Visit: Payer: Self-pay | Admitting: Nurse Practitioner

## 2020-09-16 DIAGNOSIS — C50411 Malignant neoplasm of upper-outer quadrant of right female breast: Secondary | ICD-10-CM

## 2020-09-17 ENCOUNTER — Inpatient Hospital Stay: Payer: BC Managed Care – PPO | Admitting: Hematology

## 2020-09-17 ENCOUNTER — Inpatient Hospital Stay: Payer: BC Managed Care – PPO

## 2020-10-03 NOTE — Progress Notes (Signed)
[place]   [phone] [phone]   Clinic Follow up Note   Patient Care Team: Arsenio Katz, NP as PCP - General (Nurse Practitioner) Rockwell Germany, RN as Oncology Nurse Navigator Mauro Kaufmann, RN as Oncology Nurse Navigator Donnie Mesa, MD as Consulting Physician (General Surgery) Truitt Merle, MD as Consulting Physician (Hematology) Kyung Rudd, MD as Consulting Physician (Radiation Oncology) Alla Feeling, NP as Nurse Practitioner (Nurse Practitioner)  Date of Service:  10/08/2020  CHIEF COMPLAINT: F/u of right breast cancer  SUMMARY OF ONCOLOGIC HISTORY: Oncology History Overview Note  Cancer Staging Malignant neoplasm of upper-outer quadrant of right breast in female, estrogen receptor positive ([place]) Staging form: Breast, AJCC 8th Edition - Clinical stage from 12/26/2019: Stage IB (cT2, cN0, cM0, G2, ER+, PR+, HER2-) - Unsigned - Pathologic stage from 02/22/2020: Stage IA (pT2, pN0, cM0, G2, ER+, PR+, HER2-) - Signed by Alla Feeling, NP on 06/09/2020    Malignant neoplasm of upper-outer quadrant of right breast in female, estrogen receptor positive ([place])  12/20/2019 Initial Biopsy   Diagnosis 1. Breast, right, needle core biopsy, 7:30 o'clock, 4 cmfn, ribbon clip - FIBROCYSTIC CHANGES. - THERE IS NO EVIDENCE OF MALIGNANCY. - SEE COMMENT. 2. Breast, right, needle core biopsy, 9 o'clock, 5 cmfn, coil clip - INVASIVE MAMMARY CARCINOMA. - SEE COMMENT. 3. Breast, right, needle core biopsy, outer, x clip - INVASIVE MAMMARY CARCINOMA. - ATYPICAL LOBULAR HYPERPLASIA WITH CALCIFICATIONS. - SEE COMMENT. Microscopic Comment 2. The carcinoma appears at least grade 2. The longest span of tumor is 1.2 cm. An E-Cadherin and a breast prognostic profile will be performed on part 2 and the results reported separately. Dr. Jeannie Done has reviewed part 2 and concurs with this interpretation. The results were called to [facility] on 12/21/2019. 3. The carcinoma in part 3 is morphologically dissimilar from that in part 2. The longest span of tumor is 0.1 cm. An E-Cadherin and a breast prognostic profile will also be performed on part 3 and the results reported separately. ([facility]:ah 12/21/19)   12/20/2019 Receptors her2   2. PROGNOSTIC INDICATORS Results: IMMUNOHISTOCHEMICAL AND MORPHOMETRIC ANALYSIS PERFORMED MANUALLY The tumor cells are NEGATIVE for Her2 (1+). Estrogen Receptor: 95%, POSITIVE, STRONG STAINING INTENSITY Progesterone Receptor: 95%, POSITIVE, STRONG STAINING INTENSITY Proliferation Marker Ki67: 15%   3. Prognostic indicators:  HER2 NEGATIVE  ER: 95% POSITIVE STRONG STAINING PR 90% POSITIVE STRONG STAINING  Proliferation Marker Ki67: 20%   12/20/2019 Mammogram   Diagnostic Mammogram  IMPRESSION    12/25/2019 Initial Diagnosis   Malignant neoplasm of upper-outer quadrant of right breast in female, estrogen receptor positive ([place])   12/29/2019 Breast MRI   IMPRESSION: 1. 2.5 cm spiculated mass in the upper outer right breast at middle depth consistent with the patient's biopsy-proven malignancy (coil clip). 2. Additional 9 mm enhancing mass located approximately 3 cm inferior to the index lesion. This is seen adjacent to a biopsy tract in the central right breast and may represents the additional biopsied focus of malignancy. The associated X-shaped clip was displaced 4 cm medially at the time of biopsy. 3. No MRI evidence of malignancy on the left. 4. No suspicious lymphadenopathy.   01/02/2020 Genetic Testing   Negative genetic testing:  No pathogenic variants detected on the [facility] Breast Cancer STAT panel or the Common Hereditary Cancers panel. The report dates are 01/02/2020 and 01/08/2020, respectively.  The Breast Cancer STAT Panel offered by [facility] includes sequencing and deletion/duplication analysis for  the following 9 genes:  ATM, BRCA1, BRCA2, CDH1, CHEK2, PALB2, PTEN, STK11  and TP53. The Common Hereditary Cancers Panel offered by [facility] includes sequencing and/or deletion duplication testing of the following 47 genes: APC, ATM, AXIN2, BARD1, BMPR1A, BRCA1, BRCA2, BRIP1, CDH1, CDK4, CDKN2A (p14ARF), CDKN2A (p16INK4a), CHEK2, CTNNA1, DICER1, EPCAM (Deletion/duplication testing only), GREM1 (promoter region deletion/duplication testing only), KIT, MEN1, MLH1, MSH2, MSH3, MSH6, MUTYH, NBN, NF1, NTHL1, PALB2, PDGFRA, PMS2, POLD1, POLE, PTEN, RAD50, RAD51C, RAD51D, SDHB, SDHC, SDHD, SMAD4, SMARCA4. STK11, TP53, TSC1, TSC2, and VHL.  The following genes were evaluated for sequence changes only: SDHA and HOXB13 c.251G>A variant only.   01/16/2020 Pathology Results   Diagnosis Breast, right, needle core biopsy, mid to post 1/3 out mid below known cancer - INVASIVE AND IN SITU LOBULAR CARCINOMA. - SEE MICROSCOPIC DESCRIPTION. Microscopic Comment The greatest dimension in a single core is 0.8 cm.   01/22/2020 -  Neo-Adjuvant Anti-estrogen oral therapy   Exemestane 25mg  once daily starting in 01/22/20   02/22/2020 Surgery   RIGHT MASTECTOMY WITH SENTINEL LYMPH NODE BIOPSY   02/22/2020 Pathology Results   FINAL MICROSCOPIC DIAGNOSIS:   A. BREAST, RIGHT, MASTECTOMY:  - Invasive lobular carcinoma, grade 2, spanning 4.1 cm.  - Lobular carcinoma in situ.  - Anterior margin is focally positive for invasive carcinoma.  - Perineural invasion.  - Lymphovascular invasion.  - See oncology table.   B. LYMPH NODE, RIGHT AXILLARY #1, SENTINEL, BIOPSY:  - One of one lymph nodes negative for carcinoma (0/1).   C. SKIN, RIGHT MASTECTOMY ADDITIONAL INFERIOR FLAP, EXCISION:  - Focal invasive lobular carcinoma, 2 mm, associated with lymphovascular  invasion.  - Margin is negative for carcinoma.    02/22/2020 Oncotype testing   RS 23  Distant risk of recurrence 9% Less than 1% benefit of chemotherapy   02/22/2020 Cancer Staging   Staging form: Breast, AJCC 8th Edition - Pathologic  stage from 02/22/2020: Stage IA (pT2, pN0, cM0, G2, ER+, PR+, HER2-) - Signed by Alla Feeling, NP on 06/09/2020   08/08/2020 Survivorship   SCP mailed to patient 08/08/2020      CURRENT THERAPY:  -Exemestane 25mg  once daily starting 01/22/20  INTERVAL HISTORY:  Cynthia Shaw is here for a follow up of right breast cancer. She was last seen by me 7 months ago. She presents to the clinic alone. She notes she was recently diagnosed with fibromyalgia. She was started on Baclofen and cyclobenzaprine and for her cholesterol she is on  Rosuvastatin. I reviewed her medication list with her. She notes she feels her anxiety is doing well. She notes has gained and loss weight recently and continues to lose more weight. She does not feel she needs to continue Effexor 37.5mg  so she can switch to Cymbalta. She notes her Copay for Exemestane is $600 for 90 day supply only at start of year due to deductible. She does not want to switch and does not feel she needs financial assistance. She notes she does not plan to have breast reconstruction any time soon.     REVIEW OF SYSTEMS:   Constitutional: Denies fevers, chills or abnormal weight loss Eyes: Denies blurriness of vision Ears, nose, mouth, throat, and face: Denies mucositis or sore throat Respiratory: Denies cough, dyspnea or wheezes Cardiovascular: Denies palpitation, chest discomfort or lower extremity swelling Gastrointestinal:  Denies nausea, heartburn or change in bowel habits Skin: Denies abnormal skin rashes Lymphatics: Denies new lymphadenopathy or easy bruising Neurological:Denies numbness, tingling or new weaknesses Behavioral/Psych: Mood  is stable, no new changes  All other systems were reviewed with the patient and are negative.  MEDICAL HISTORY:  Past Medical History:  Diagnosis Date  . Anxiety   . Cancer ([place]) 12/2019   right breast invasive lobular cancer  . Depression   . Family history of colon cancer   . Family history of  melanoma   . Family history of throat cancer   . Family history of uterine cancer   . Hypertension     SURGICAL HISTORY: Past Surgical History:  Procedure Laterality Date  . c sections      x3  . CESAREAN SECTION    . KNEE ARTHROSCOPY Left   . MASTECTOMY W/ SENTINEL NODE BIOPSY Right 02/22/2020   Procedure: RIGHT MASTECTOMY WITH SENTINEL LYMPH NODE BIOPSY;  Surgeon: Donnie Mesa, MD;  Location: [facility];  Service: General;  Laterality: Right;  PEC BLOCK  . TONSILLECTOMY      I have reviewed the social history and family history with the patient and they are unchanged from previous note.  ALLERGIES:  is allergic to tylenol with codeine #3 [acetaminophen-codeine] and sulfa antibiotics.  MEDICATIONS:  Current Outpatient Medications  Medication Sig Dispense Refill  . baclofen (LIORESAL) 10 MG tablet Take 10 mg by mouth 2 (two) times daily.    Marland Kitchen venlafaxine (EFFEXOR) 37.5 MG tablet Take 37.5 mg by mouth daily.    Marland Kitchen acetaminophen (TYLENOL) 500 MG tablet Take 500 mg by mouth every 6 (six) hours as needed.    . cyclobenzaprine (FLEXERIL) 10 MG tablet Take 10 mg by mouth at bedtime.    Marland Kitchen exemestane (AROMASIN) 25 MG tablet TAKE 1 TABLET BY MOUTH DAILY AFTER BREAKFAST 90 tablet 1  . ibuprofen (ADVIL) 800 MG tablet Take by mouth.    Marland Kitchen lisinopril (ZESTRIL) 10 MG tablet Take 10 mg by mouth daily.    . rosuvastatin (CRESTOR) 10 MG tablet Take 10 mg by mouth at bedtime.    . Vitamin D, Ergocalciferol, (DRISDOL) 1.25 MG (50000 UNIT) CAPS capsule Take 50,000 Units by mouth once a week.     No current facility-administered medications for this visit.    PHYSICAL EXAMINATION: ECOG PERFORMANCE STATUS: 0 - Asymptomatic  Vitals:   10/08/20 0938  BP: 118/73  Pulse: 87  Resp: 14  Temp: 97.6 F (36.4 C)  SpO2: 97%   Filed Weights   10/08/20 0938  Weight: 251 lb 1.6 oz (113.9 kg)    GENERAL:alert, no distress and comfortable SKIN: skin color, texture, turgor are normal,  no rashes or significant lesions EYES: normal, Conjunctiva are pink and non-injected, sclera clear  NECK: supple, thyroid normal size, non-tender, without nodularity LYMPH:  no palpable lymphadenopathy in the cervical, axillary  LUNGS: clear to auscultation and percussion with normal breathing effort HEART: regular rate & rhythm and no murmurs and no lower extremity edema ABDOMEN:abdomen soft, non-tender and normal bowel sounds Musculoskeletal:no cyanosis of digits and no clubbing  NEURO: alert & oriented x 3 with fluent speech, no focal motor/sensory deficits BREAST: S/p right mastectomy: Surgical incision healed well with soft tissue fullness and bony protrusion at site. No palpable mass, nodules or adenopathy bilaterally. Breast exam benign.   LABORATORY DATA:  I have reviewed the data as listed CBC Latest Ref Rng & Units 10/08/2020 12/26/2019  WBC 4.0 - 10.5 K/uL 7.2 8.0  Hemoglobin 12.0 - 15.0 g/dL 15.0 15.1(H)  Hematocrit 36.0 - 46.0 % 45.7 44.8  Platelets 150 - 400 K/uL 237 272  CMP Latest Ref Rng & Units 10/08/2020 12/26/2019  Glucose 70 - 99 mg/dL 105(H) 100(H)  BUN 6 - 20 mg/dL 13 17  Creatinine 0.44 - 1.00 mg/dL 0.79 0.94  Sodium 135 - 145 mmol/L 138 142  Potassium 3.5 - 5.1 mmol/L 4.5 4.2  Chloride 98 - 111 mmol/L 105 106  CO2 22 - 32 mmol/L 26 26  Calcium 8.9 - 10.3 mg/dL 9.8 10.4(H)  Total Protein 6.5 - 8.1 g/dL 7.1 7.6  Total Bilirubin 0.3 - 1.2 mg/dL 0.5 0.6  Alkaline Phos 38 - 126 U/L 70 67  AST 15 - 41 U/L 17 30  ALT 0 - 44 U/L 27 53(H)      RADIOGRAPHIC STUDIES: I have personally reviewed the radiological images as listed and agreed with the findings in the report. No results found.   ASSESSMENT & PLAN:  Cynthia Shaw is a 60 y.o. female with     1.Malignant neoplasm of upper-outer quadrant of right breast,invasive lobular carcinoma, stageIB,c(T2N0M0), ER/PR+/HER2-,GradeII, Oncotype RS 23 -She was diagnosed in 11/2019. She has a2.6cm mass at  9:00 position of right breast and another small mass adjacent, both with invasive lobular carcinoma. -Her Breast MRI from 12/29/19 shows her known 2.6cm mass and another 21mm mass of right breast. The 72mm mass was biopsied on 01/16/20 which showed invasive and IN SITU lobular carcinoma.She overall has 3 right breast lesions of multifocal cancer.  -Given her multifocal disease, she underwent right mastectomy with Dr. Georgette Dover on 02/22/20. Her Oncotype RS was 23. Due to the low risk disease, I do not recommend adjuvant chemotherapy and did not require Radiation. -She started Exemestane before her breast surgery on 01/22/20. She has tolerated well with hot flashes. Plan to continue for 7-10 years given her lobular carcinoma histology.  -She is clinically doing well. Lab reviewed, her CBC and CMP are within normal limits except BG 105. Her physical exam was benign. There is no clinical concern for recurrence. -Continue Surveillance. Next mammogram in May 2022. She is interested in MRI, plan to start in 05/2021.  -Lab and F/u in 6 months    2. H/o Cervical Cancer, On situ carcinoma, Genetic Testing (negative) -Dx 1990 and treated at [facility] in [place], [place]. Pap smears have been benign since.  -She has family history of Endometrial cancer, ADH, and colon cancer. -She has negative genetic testing: No pathogenic variants detected on the [facility] Breast Cancer STAT panel. The report date is 01/02/2020.  3. Anxiety/Depression, Hot flashes -She had been anxious and depressed about her cancer diagnosis.  -I started her on Effexor for her mood and hot flashes from [facility].   -She has improved anxiety lately. She plans to switch her Effexor to Cymbalta given recent Fibromyalgia.   4. Arthritis, Weight gain, Fibromyalgia  -She has chronic left hip and back pain since 2019. This resolved after shots to area and weight loss.  -She did gain weight during COVID in 2020 and has pain has recurred in the past 6  months.She was recently diagnosed with fibromyalgia in 2022.  -Will watch on Exemestane. -She has been able to lose some weight lately and continues to try to lose more. I encouraged her to continue.   5. Smoking Cessation: She was able to quit smoking in 02/2020.   6. Bone Health, Vit D Deficiency  -She notes she is currently on prescription Vit D. When she completes, she can reduce to Vit D 1000-5000 units daily.  -Per pt her last DEXA was done  locally 2 years ago. Will order new DEXA at [facility] for 2022.    PLAN: -Left mammogram in 11/2020.  -Lab and f/u with NP Lacie in 6 months  -continue exemestane    No problem-specific Assessment & Plan notes found for this encounter.   Orders Placed This Encounter  Procedures  . MM Digital Screening Unilat L    Standing Status:   Future    Standing Expiration Date:   10/08/2021    Order Specific Question:   Reason for Exam (SYMPTOM  OR DIAGNOSIS REQUIRED)    Answer:   screening breast cancer    Order Specific Question:   Is the patient pregnant?    Answer:   No    Order Specific Question:   Preferred imaging location?    Answer:   [facility]   All questions were answered. The patient knows to call the clinic with any problems, questions or concerns. No barriers to learning was detected. The total time spent in the appointment was 25 minutes.     Truitt Merle, MD 10/08/2020   I, Joslyn Devon, am acting as scribe for Truitt Merle, MD.   I have reviewed the above documentation for accuracy and completeness, and I agree with the above.

## 2020-10-08 ENCOUNTER — Other Ambulatory Visit: Payer: Self-pay

## 2020-10-08 ENCOUNTER — Inpatient Hospital Stay: Payer: BC Managed Care – PPO | Attending: Nurse Practitioner

## 2020-10-08 ENCOUNTER — Encounter: Payer: Self-pay | Admitting: Hematology

## 2020-10-08 ENCOUNTER — Inpatient Hospital Stay (HOSPITAL_BASED_OUTPATIENT_CLINIC_OR_DEPARTMENT_OTHER): Payer: BC Managed Care – PPO | Admitting: Hematology

## 2020-10-08 VITALS — BP 118/73 | HR 87 | Temp 97.6°F | Resp 14 | Ht 68.0 in | Wt 251.1 lb

## 2020-10-08 DIAGNOSIS — M797 Fibromyalgia: Secondary | ICD-10-CM | POA: Insufficient documentation

## 2020-10-08 DIAGNOSIS — F418 Other specified anxiety disorders: Secondary | ICD-10-CM | POA: Diagnosis not present

## 2020-10-08 DIAGNOSIS — Z79899 Other long term (current) drug therapy: Secondary | ICD-10-CM | POA: Insufficient documentation

## 2020-10-08 DIAGNOSIS — Z170 Estrogen receptor positive status [ER+]: Secondary | ICD-10-CM

## 2020-10-08 DIAGNOSIS — Z800 Family history of malignant neoplasm of digestive organs: Secondary | ICD-10-CM | POA: Insufficient documentation

## 2020-10-08 DIAGNOSIS — Z808 Family history of malignant neoplasm of other organs or systems: Secondary | ICD-10-CM | POA: Diagnosis not present

## 2020-10-08 DIAGNOSIS — C50411 Malignant neoplasm of upper-outer quadrant of right female breast: Secondary | ICD-10-CM | POA: Diagnosis present

## 2020-10-08 DIAGNOSIS — Z79811 Long term (current) use of aromatase inhibitors: Secondary | ICD-10-CM | POA: Diagnosis not present

## 2020-10-08 DIAGNOSIS — R635 Abnormal weight gain: Secondary | ICD-10-CM | POA: Diagnosis not present

## 2020-10-08 DIAGNOSIS — Z1231 Encounter for screening mammogram for malignant neoplasm of breast: Secondary | ICD-10-CM | POA: Diagnosis not present

## 2020-10-08 DIAGNOSIS — E559 Vitamin D deficiency, unspecified: Secondary | ICD-10-CM | POA: Diagnosis not present

## 2020-10-08 DIAGNOSIS — Z9011 Acquired absence of right breast and nipple: Secondary | ICD-10-CM | POA: Diagnosis not present

## 2020-10-08 DIAGNOSIS — Z801 Family history of malignant neoplasm of trachea, bronchus and lung: Secondary | ICD-10-CM | POA: Diagnosis not present

## 2020-10-08 DIAGNOSIS — M1990 Unspecified osteoarthritis, unspecified site: Secondary | ICD-10-CM | POA: Insufficient documentation

## 2020-10-08 DIAGNOSIS — R634 Abnormal weight loss: Secondary | ICD-10-CM | POA: Insufficient documentation

## 2020-10-08 DIAGNOSIS — Z87891 Personal history of nicotine dependence: Secondary | ICD-10-CM | POA: Insufficient documentation

## 2020-10-08 DIAGNOSIS — R232 Flushing: Secondary | ICD-10-CM | POA: Insufficient documentation

## 2020-10-08 LAB — CBC WITH DIFFERENTIAL (CANCER CENTER ONLY)
Abs Immature Granulocytes: 0.02 10*3/uL (ref 0.00–0.07)
Basophils Absolute: 0 10*3/uL (ref 0.0–0.1)
Basophils Relative: 1 %
Eosinophils Absolute: 0.1 10*3/uL (ref 0.0–0.5)
Eosinophils Relative: 2 %
HCT: 45.7 % (ref 36.0–46.0)
Hemoglobin: 15 g/dL (ref 12.0–15.0)
Immature Granulocytes: 0 %
Lymphocytes Relative: 26 %
Lymphs Abs: 1.8 10*3/uL (ref 0.7–4.0)
MCH: 32.7 pg (ref 26.0–34.0)
MCHC: 32.8 g/dL (ref 30.0–36.0)
MCV: 99.6 fL (ref 80.0–100.0)
Monocytes Absolute: 0.4 10*3/uL (ref 0.1–1.0)
Monocytes Relative: 6 %
Neutro Abs: 4.7 10*3/uL (ref 1.7–7.7)
Neutrophils Relative %: 65 %
Platelet Count: 237 10*3/uL (ref 150–400)
RBC: 4.59 MIL/uL (ref 3.87–5.11)
RDW: 11.8 % (ref 11.5–15.5)
WBC Count: 7.2 10*3/uL (ref 4.0–10.5)
nRBC: 0 % (ref 0.0–0.2)

## 2020-10-08 LAB — CMP (CANCER CENTER ONLY)
ALT: 27 U/L (ref 0–44)
AST: 17 U/L (ref 15–41)
Albumin: 4.3 g/dL (ref 3.5–5.0)
Alkaline Phosphatase: 70 U/L (ref 38–126)
Anion gap: 7 (ref 5–15)
BUN: 13 mg/dL (ref 6–20)
CO2: 26 mmol/L (ref 22–32)
Calcium: 9.8 mg/dL (ref 8.9–10.3)
Chloride: 105 mmol/L (ref 98–111)
Creatinine: 0.79 mg/dL (ref 0.44–1.00)
GFR, Estimated: >60 mL/min (ref >60)
Glucose, Bld: 105 mg/dL — ABNORMAL HIGH (ref 70–99)
Potassium: 4.5 mmol/L (ref 3.5–5.1)
Sodium: 138 mmol/L (ref 135–145)
Total Bilirubin: 0.5 mg/dL (ref 0.3–1.2)
Total Protein: 7.1 g/dL (ref 6.5–8.1)

## 2020-12-12 ENCOUNTER — Ambulatory Visit: Payer: BC Managed Care – PPO

## 2020-12-12 ENCOUNTER — Ambulatory Visit
Admission: RE | Admit: 2020-12-12 | Discharge: 2020-12-12 | Disposition: A | Discharge status: Home / Self Care | Payer: BC Managed Care – PPO | Source: Ambulatory Visit | Attending: Hematology | Admitting: Hematology

## 2020-12-12 ENCOUNTER — Other Ambulatory Visit: Payer: Self-pay

## 2020-12-12 DIAGNOSIS — Z1231 Encounter for screening mammogram for malignant neoplasm of breast: Secondary | ICD-10-CM

## 2020-12-12 IMAGING — MG DIGITAL SCREENING UNILAT LEFT W/ TOMO W/ CAD
6 series · 6 of 18 positions shown · non-contrast
Comparison: Previous exam(s).

CLINICAL DATA: Screening. History of right mastectomy [DATE].

EXAM:
DIGITAL SCREENING UNILATERAL LEFT MAMMOGRAM WITH CAD AND
TOMOSYNTHESIS
TECHNIQUE: Left screening digital craniocaudal and mediolateral oblique
mammograms were obtained. Left screening digital breast
tomosynthesis was performed. The images were evaluated with
computer-aided detection.

[L CC synth-2D (1 of 2)]
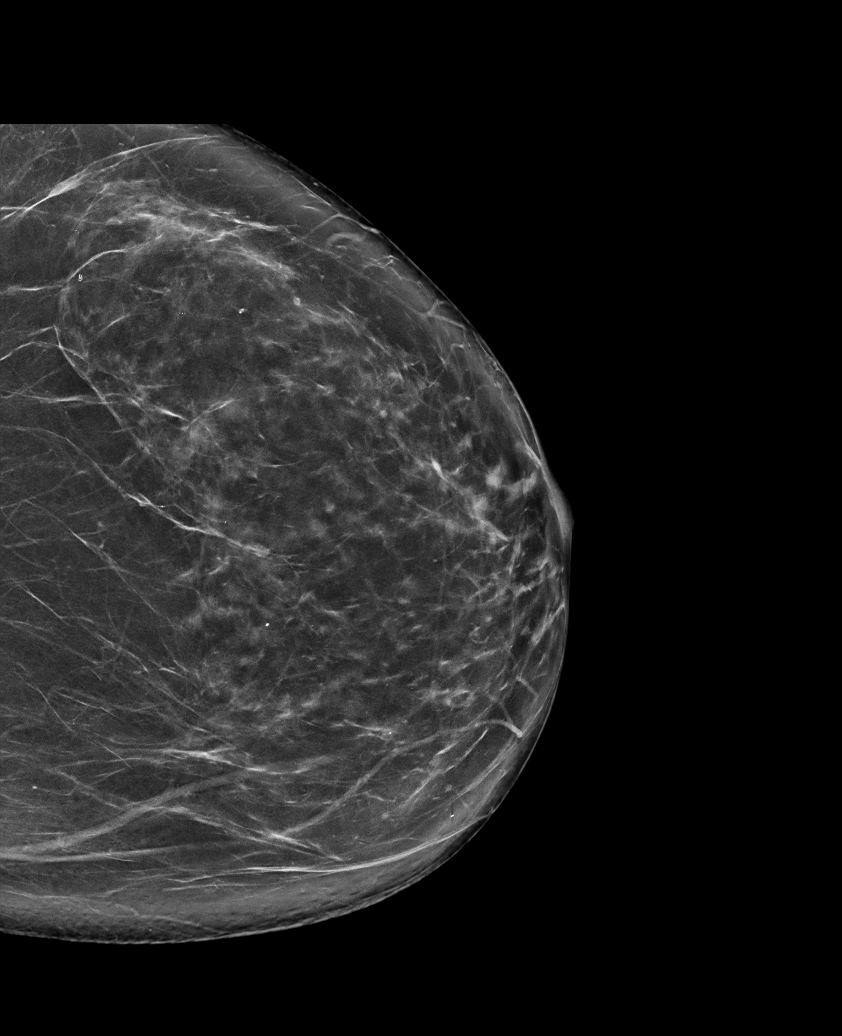

[L MLO synth-2D]
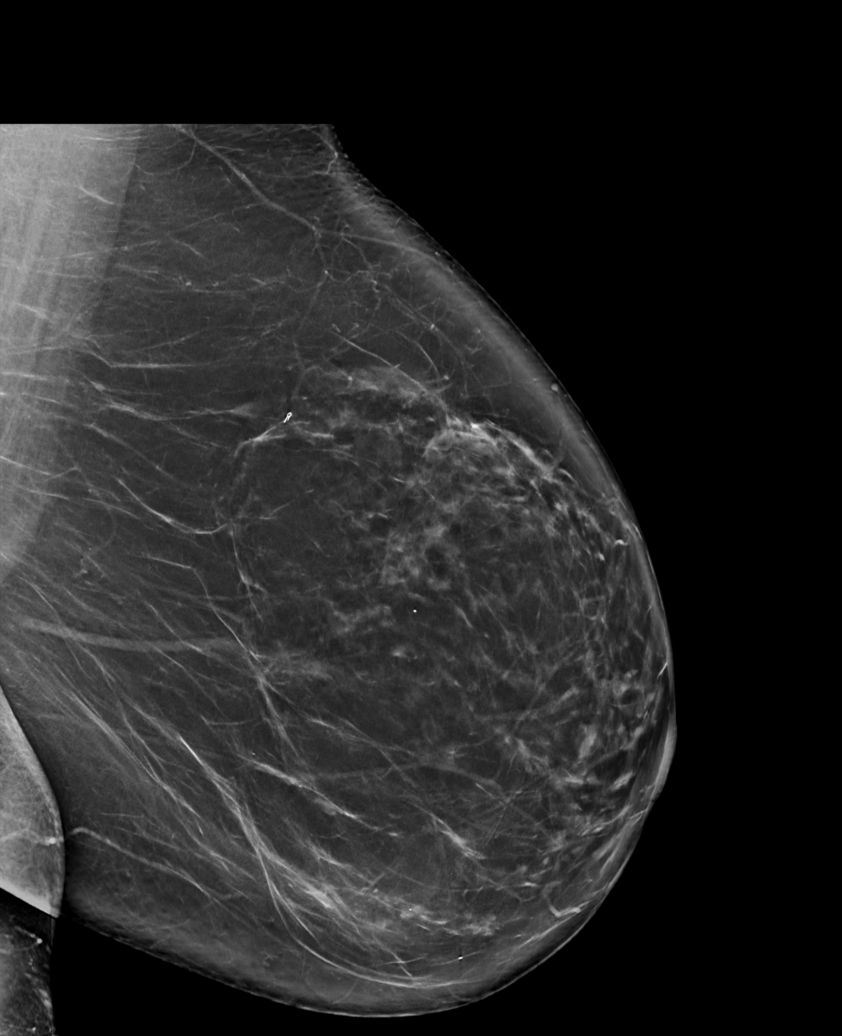

[L CC synth-2D (2 of 2)]
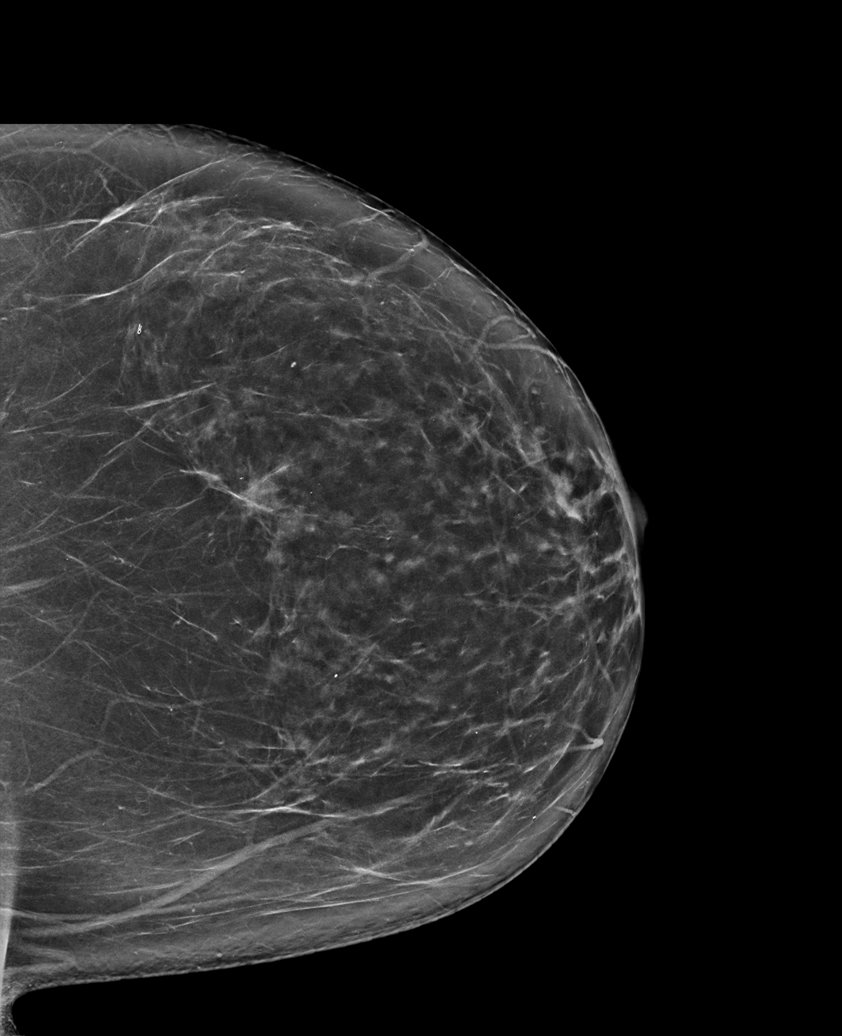

[L CC tomo (1 of 2) · tomo slice 45/88.0]
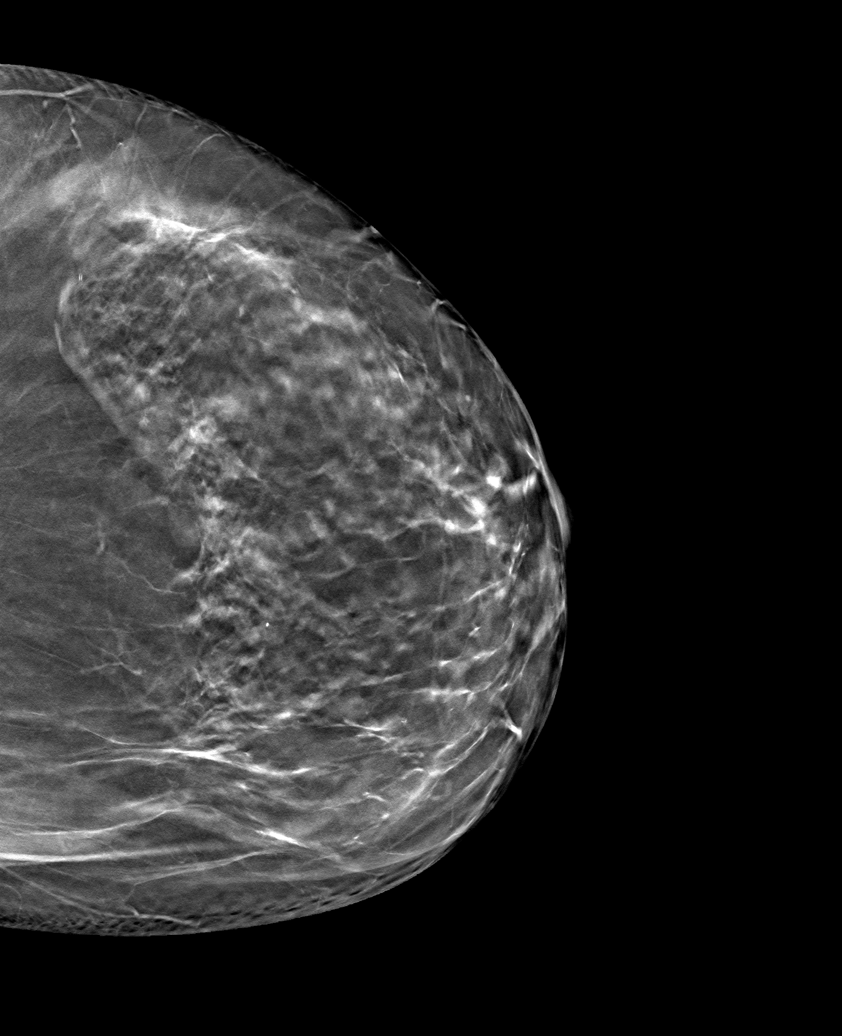

[L CC tomo (2 of 2) · tomo slice 43/86.0]
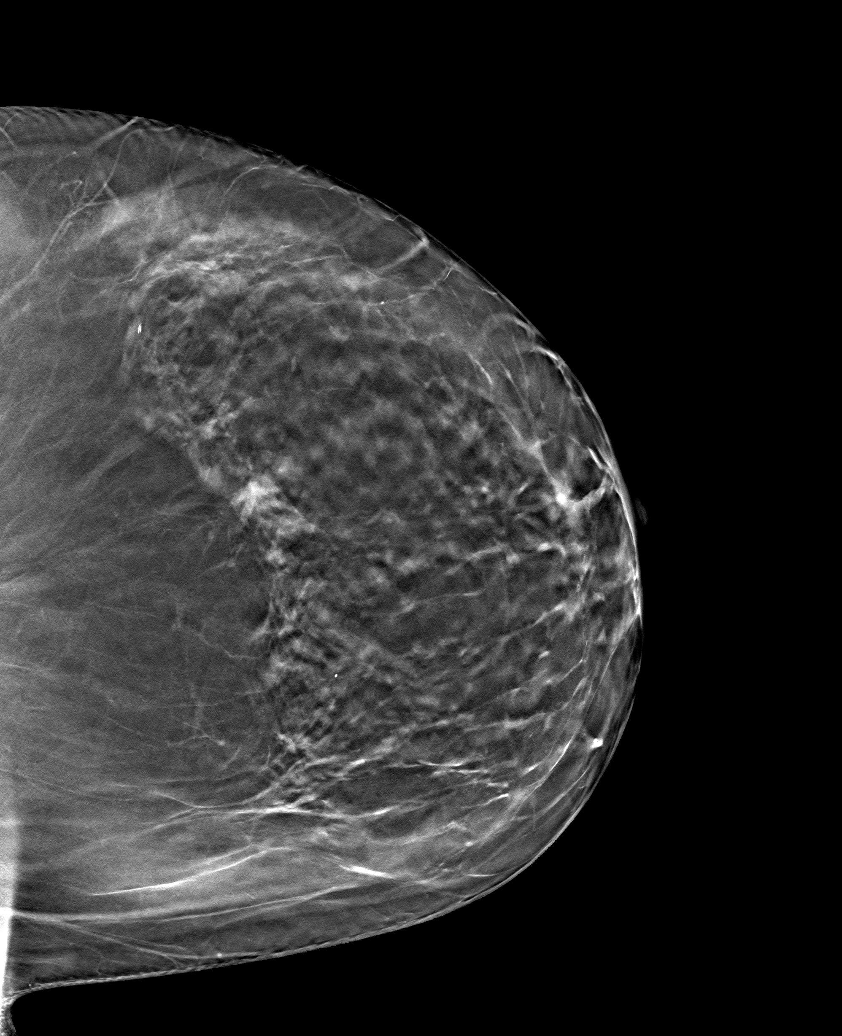

[L MLO tomo · tomo slice 49/98.0]
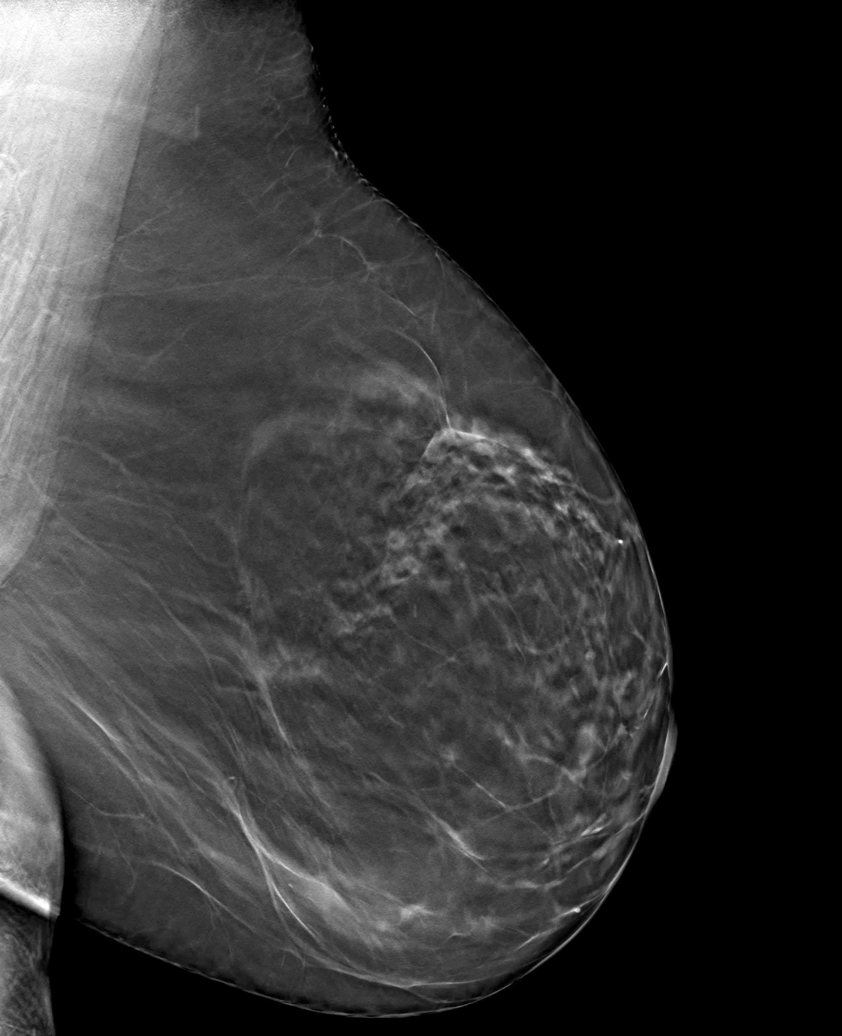

[6 of 18 positions shown; findings below may reference images not displayed]

ACR Breast Density Category b: There are scattered areas of
fibroglandular density.
FINDINGS: There are no findings suspicious for malignancy. The patient has had
a right mastectomy. The images were evaluated with computer-aided
detection.
IMPRESSION: No mammographic evidence of malignancy. A result letter of this
screening mammogram will be mailed directly to the patient.

RECOMMENDATION:
Screening mammogram in one year. (Code:[ZR])

BI-RADS CATEGORY  1: Negative.

## 2021-03-05 ENCOUNTER — Other Ambulatory Visit: Payer: Self-pay | Admitting: Hematology

## 2021-04-09 ENCOUNTER — Inpatient Hospital Stay: Payer: BC Managed Care – PPO

## 2021-04-09 ENCOUNTER — Inpatient Hospital Stay: Payer: BC Managed Care – PPO | Admitting: Nurse Practitioner

## 2021-05-12 NOTE — Progress Notes (Signed)
[name]   [phone] [phone]   Clinic Follow up Note   Patient Care Team: Arsenio Katz, NP as PCP - General (Nurse Practitioner) Rockwell Germany, RN as Oncology Nurse Navigator Mauro Kaufmann, RN as Oncology Nurse Navigator Donnie Mesa, MD as Consulting Physician (General Surgery) Truitt Merle, MD as Consulting Physician (Hematology) Kyung Rudd, MD as Consulting Physician (Radiation Oncology) Alla Feeling, NP as Nurse Practitioner (Nurse Practitioner) 05/13/2021  CHIEF COMPLAINT: Follow up right breast cancer   SUMMARY OF ONCOLOGIC HISTORY: Oncology History Overview Note  Cancer Staging Malignant neoplasm of upper-outer quadrant of right breast in female, estrogen receptor positive ([place]) Staging form: Breast, AJCC 8th Edition - Clinical stage from 12/26/2019: Stage IB (cT2, cN0, cM0, G2, ER+, PR+, HER2-) - Unsigned - Pathologic stage from 02/22/2020: Stage IA (pT2, pN0, cM0, G2, ER+, PR+, HER2-) - Signed by Alla Feeling, NP on 06/09/2020    Malignant neoplasm of upper-outer quadrant of right breast in female, estrogen receptor positive ([place])  12/20/2019 Initial Biopsy   Diagnosis 1. Breast, right, needle core biopsy, 7:30 o'clock, 4 cmfn, ribbon clip - FIBROCYSTIC CHANGES. - THERE IS NO EVIDENCE OF MALIGNANCY. - SEE COMMENT. 2. Breast, right, needle core biopsy, 9 o'clock, 5 cmfn, coil clip - INVASIVE MAMMARY CARCINOMA. - SEE COMMENT. 3. Breast, right, needle core biopsy, outer, x clip - INVASIVE MAMMARY CARCINOMA. - ATYPICAL LOBULAR HYPERPLASIA WITH CALCIFICATIONS. - SEE COMMENT. Microscopic Comment 2. The carcinoma appears at least grade 2. The longest span of tumor is 1.2 cm. An E-Cadherin and a breast prognostic profile will be performed on part 2 and the results reported separately. Dr. Jeannie Done has reviewed part 2 and concurs with this interpretation. The results were called to [facility] on  12/21/2019. 3. The carcinoma in part 3 is morphologically dissimilar from that in part 2. The longest span of tumor is 0.1 cm. An E-Cadherin and a breast prognostic profile will also be performed on part 3 and the results reported separately. (JBK:ah 12/21/19)   12/20/2019 Receptors her2   2. PROGNOSTIC INDICATORS Results: IMMUNOHISTOCHEMICAL AND MORPHOMETRIC ANALYSIS PERFORMED MANUALLY The tumor cells are NEGATIVE for Her2 (1+). Estrogen Receptor: 95%, POSITIVE, STRONG STAINING INTENSITY Progesterone Receptor: 95%, POSITIVE, STRONG STAINING INTENSITY Proliferation Marker Ki67: 15%   3. Prognostic indicators:  HER2 NEGATIVE  ER: 95% POSITIVE STRONG STAINING PR 90% POSITIVE STRONG STAINING  Proliferation Marker Ki67: 20%   12/20/2019 Mammogram   Diagnostic Mammogram  IMPRESSION    12/25/2019 Initial Diagnosis   Malignant neoplasm of upper-outer quadrant of right breast in female, estrogen receptor positive ([place])   12/29/2019 Breast MRI   IMPRESSION: 1. 2.5 cm spiculated mass in the upper outer right breast at middle depth consistent with the patient's biopsy-proven malignancy (coil clip). 2. Additional 9 mm enhancing mass located approximately 3 cm inferior to the index lesion. This is seen adjacent to a biopsy tract in the central right breast and may represents the additional biopsied focus of malignancy. The associated X-shaped clip was displaced 4 cm medially at the time of biopsy. 3. No MRI evidence of malignancy on the left. 4. No suspicious lymphadenopathy.   01/02/2020 Genetic Testing   Negative genetic testing:  No pathogenic variants detected on the [facility] Breast Cancer STAT panel or the Common Hereditary Cancers panel. The report dates are 01/02/2020 and 01/08/2020, respectively.  The Breast Cancer STAT Panel offered by [facility] includes sequencing and deletion/duplication analysis for the following 9 genes:  ATM, BRCA1, BRCA2, CDH1, CHEK2, PALB2, PTEN, STK11 and TP53. The  Common Hereditary Cancers Panel offered by [facility] includes sequencing and/or deletion duplication testing of the following 47 genes: APC, ATM, AXIN2, BARD1, BMPR1A, BRCA1, BRCA2, BRIP1, CDH1, CDK4, CDKN2A (p14ARF), CDKN2A (p16INK4a), CHEK2, CTNNA1, DICER1, EPCAM (Deletion/duplication testing only), GREM1 (promoter region deletion/duplication testing only), KIT, MEN1, MLH1, MSH2, MSH3, MSH6, MUTYH, NBN, NF1, NTHL1, PALB2, PDGFRA, PMS2, POLD1, POLE, PTEN, RAD50, RAD51C, RAD51D, SDHB, SDHC, SDHD, SMAD4, SMARCA4. STK11, TP53, TSC1, TSC2, and VHL.  The following genes were evaluated for sequence changes only: SDHA and HOXB13 c.251G>A variant only.   01/16/2020 Pathology Results   Diagnosis Breast, right, needle core biopsy, mid to post 1/3 out mid below known cancer - INVASIVE AND IN SITU LOBULAR CARCINOMA. - SEE MICROSCOPIC DESCRIPTION. Microscopic Comment The greatest dimension in a single core is 0.8 cm.   01/22/2020 -  Neo-Adjuvant Anti-estrogen oral therapy   Exemestane 55m once daily starting in 01/22/20   02/22/2020 Surgery   RIGHT MASTECTOMY WITH SENTINEL LYMPH NODE BIOPSY   02/22/2020 Pathology Results   FINAL MICROSCOPIC DIAGNOSIS:   A. BREAST, RIGHT, MASTECTOMY:  - Invasive lobular carcinoma, grade 2, spanning 4.1 cm.  - Lobular carcinoma in situ.  - Anterior margin is focally positive for invasive carcinoma.  - Perineural invasion.  - Lymphovascular invasion.  - See oncology table.   B. LYMPH NODE, RIGHT AXILLARY #1, SENTINEL, BIOPSY:  - One of one lymph nodes negative for carcinoma (0/1).   C. SKIN, RIGHT MASTECTOMY ADDITIONAL INFERIOR FLAP, EXCISION:  - Focal invasive lobular carcinoma, 2 mm, associated with lymphovascular  invasion.  - Margin is negative for carcinoma.    02/22/2020 Oncotype testing   RS 23  Distant risk of recurrence 9% Less than 1% benefit of chemotherapy   02/22/2020 Cancer Staging   Staging form: Breast, AJCC 8th Edition - Pathologic stage from  02/22/2020: Stage IA (pT2, pN0, cM0, G2, ER+, PR+, HER2-) - Signed by BAlla Feeling NP on 06/09/2020   08/08/2020 Survivorship   SCP mailed to patient 08/08/2020     CURRENT THERAPY: Exemestane 25 mg once daily starting 01/22/20  INTERVAL HISTORY: Ms. NTorrezreturns for follow up as scheduled. A survivorship care plan was mailed to her in 07/2020, she was seen by Dr. FBurr Medicoin 09/2020. Left mammo 12/12/20 was negative. She anticipates screening breast MRI in November.  She has been dealing with various symptoms including leg weakness, muscle spasms in, loss of balance, recent fall, eye twitching, tongue tingling.  Her rheumatologist attributes this to fibromyalgia, patient is requesting a from neuro work-up.  The symptoms make it difficult to do basic activities in her job.  She does not feel Cymbalta is helping her pain and wants to stop it.  Her mood and hot flashes were better on Effexor and she felt better in general, wants to go back on it.  Hot flashes are severe but manageable without it.  The anxiety of all of these symptoms cause her to be short of breath at times.  Denies chest pain.  Is upset by her weight gain since starting antiestrogen therapy.  She has occasional shooting pain at the right chest wall, denies new nodularity or mass, or left nipple discharge or inversion.  MEDICAL HISTORY:  Past Medical History:  Diagnosis Date   Anxiety    [place] 12/[place]   right breast invasive lobular cancer   Depression    Family history of colon cancer    Family  history of melanoma    Family history of throat cancer    Family history of uterine cancer    Hypertension     SURGICAL HISTORY: Past Surgical History:  Procedure Laterality Date   c sections      x3   CESAREAN SECTION     KNEE ARTHROSCOPY Left    MASTECTOMY W/ SENTINEL NODE BIOPSY Right 02/22/2020   Procedure: RIGHT MASTECTOMY WITH SENTINEL LYMPH NODE BIOPSY;  Surgeon: Donnie Mesa, MD;  Location: [facility];  Service: General;  Laterality: Right;  PEC BLOCK   TONSILLECTOMY      I have reviewed the social history and family history with the patient and they are unchanged from previous note.  ALLERGIES:  is allergic to tylenol with codeine #3 [acetaminophen-codeine] and sulfa antibiotics.  MEDICATIONS:  Current Outpatient Medications  Medication Sig Dispense Refill   acetaminophen (TYLENOL) 500 MG tablet Take 500 mg by mouth every 6 (six) hours as needed.     baclofen (LIORESAL) 10 MG tablet Take 10 mg by mouth 2 (two) times daily.     cyclobenzaprine (FLEXERIL) 10 MG tablet Take 10 mg by mouth at bedtime.     exemestane (AROMASIN) 25 MG tablet TAKE 1 TABLET BY MOUTH DAILY AFTER BREAKFAST 90 tablet 1   ibuprofen (ADVIL) 800 MG tablet Take by mouth.     lisinopril (ZESTRIL) 10 MG tablet Take 10 mg by mouth daily.     rosuvastatin (CRESTOR) 10 MG tablet Take 10 mg by mouth at bedtime.     venlafaxine (EFFEXOR) 37.5 MG tablet Take 1 tablet (37.5 mg total) by mouth daily. 30 tablet 1   Vitamin D, Ergocalciferol, (DRISDOL) 1.25 MG (50000 UNIT) CAPS capsule Take 50,000 Units by mouth once a week.     No current facility-administered medications for this visit.    PHYSICAL EXAMINATION: ECOG PERFORMANCE STATUS: 1-2  Vitals:   05/13/21 1027  BP: 122/70  Pulse: 94  Resp: 18  SpO2: 97%   Filed Weights   05/13/21 1027  Weight: 265 lb 7 oz (120.4 kg)    GENERAL:alert, no distress and comfortable SKIN: No rash EYES: sclera clear OROPHARYNX:no exudate, no erythema and lips, buccal mucosa, and tongue normal  NECK: Without mass LYMPH:  no palpable apical or supraclavicular lymphadenopathy  LUNGS: normal breathing effort HEART: no lower extremity edema ABDOMEN:abdomen soft, non-tender and normal bowel sounds Musculoskeletal: No focal spinal tenderness NEURO: alert & oriented x 3 with fluent speech, no focal motor deficits Breast exam: S/p right mastectomy, incision completely  healed.  Prominent soft tissue without palpable mass or nodularity along the chest wall, left breast, or either axilla that I could appreciate.  Benign exam  LABORATORY DATA:  I have reviewed the data as listed CBC Latest Ref Rng & Units 05/13/2021 10/08/2020 12/26/2019  WBC 4.0 - 10.5 K/uL 8.2 7.2 8.0  Hemoglobin 12.0 - 15.0 g/dL 14.6 15.0 15.1(H)  Hematocrit 36.0 - 46.0 % 43.7 45.7 44.8  Platelets 150 - 400 K/uL 237 237 272     CMP Latest Ref Rng & Units 05/13/2021 10/08/2020 12/26/2019  Glucose 70 - 99 mg/dL 108(H) 105(H) 100(H)  BUN 6 - 20 mg/dL '15 13 17  ' Creatinine 0.44 - 1.00 mg/dL 0.72 0.79 0.94  Sodium 135 - 145 mmol/L 141 138 142  Potassium 3.5 - 5.1 mmol/L 4.5 4.5 4.2  Chloride 98 - 111 mmol/L 107 105 106  CO2 22 - 32 mmol/L '24 26 26  ' Calcium 8.9 -  10.3 mg/dL 10.0 9.8 10.4(H)  Total Protein 6.5 - 8.1 g/dL 7.1 7.1 7.6  Total Bilirubin 0.3 - 1.2 mg/dL 0.5 0.5 0.6  Alkaline Phos 38 - 126 U/L 91 70 67  AST 15 - 41 U/L '22 17 30  ' ALT 0 - 44 U/L 36 27 53(H)      RADIOGRAPHIC STUDIES: I have personally reviewed the radiological images as listed and agreed with the findings in the report. No results found.   ASSESSMENT & PLAN: Cynthia Shaw is a 60 y.o. female with       1. 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 Given her multifocal disease, she underwent right mastectomy with Dr. Georgette Dover on 02/22/20. Her Oncotype RS was 23. Due to the low risk disease, adjuvant chemotherapy was not recommended.  She did not require post mastectomy radiation. -She started Exemestane before her breast surgery on 01/22/20. She has tolerated well with hot flashes managed well with Effexor. Plan to continue for 7-10 years given her lobular carcinoma histology.  -Cynthia Shaw is clinically doing well from a breast cancer standpoint, exam is benign.  CBC  and CMP are unremarkable.  Screening left mammogram 12/16/2020 is negative.  Overall there is no clinical concern for breast cancer recurrence. -Continue surveillance and AI.  She is agreeable to proceed with screening breast MRI in November, I will call her with results -We will obtain baseline DEXA with next mammogram in 11/2021 -next routine lab and office visit in 6 months, or sooner if needed.    2. H/o Cervical Cancer, On situ carcinoma, Genetic Testing (negative) -Dx 1990 and treated at [facility] in [place], [place]. Pap smears have been benign since.  -She has family history of Endometrial cancer, ADH, and colon cancer. -She has negative genetic testing: No pathogenic variants detected on the [facility] Breast Cancer STAT panel. The report date is 01/02/2020.   3. Anxiety/Depression, Hot flashes -She had been anxious and depressed about her cancer diagnosis and recent weight gain.  -She started Effexor for mood and hot flashes from AI, this was very effective however she weaned off and started Cymbalta for fibromyalgia -Cymbalta has not been effective and she prefers to go back on Effexor.  We reviewed the weaning process and how to resume Effexor, I refilled for her today.   4. Arthritis, Weight gain, Fibromyalgia, neuro symptoms (weakness, twitching, tingling, fall, balance issue) -She has chronic left hip and back pain since 2019. This resolved after shots to area and weight loss.  -She gained weight through [place] and with breast cancer diagnosis and antiestrogen therapy.  Her generalized body pain makes it difficult to exercise -I referred her to medical weight loss management -She has various neuro symptoms including muscle weakness and twitching, loss of balance, tongue tingling, and fall.  She is requesting full neuro work-up.  I referred her to Dr. Krista Blue at [facility] neurologic -CBC reviewed, she is not anemic but has mild macrocytosis.  She cannot stay for B12 testing, I recommend  to start oral supplement to see if some neuro symptoms are related to possible B12 deficiency   5. Smoking Cessation and health maintenance: - She was able to quit smoking in 02/2020.  -She is requesting referral to new PCP, referral placed in epic today.  Her new PCP office will request records from [facility] internal   6. Bone Health, Vit D Deficiency  -  She notes she is currently on prescription Vit D. When she completes, she can reduce to Vit D 1000-5000 units daily.  -Per pt her last DEXA was done locally 2 years ago.  -Next DEXA due 11/2021 with left mammo  PLAN: -Labs, L mammo reviewed -No clinical concern for recurrence, continue exemstane and surveillance -Screening breast MRI in 05/2021, order placed -Wean off cymbalta take 1 cap every other day for 2 weeks, then off 3-4 days, then start effexor once daily. Effexor refilled -Start oral B12 once daily (for mild macrocytosis and neuro symptoms) -Referrals to neurology, medical weight loss, and new PCP -DEXA with L mammo in 11/2021 -Lab and surveillance visit in 6 months    Orders Placed This Encounter  Procedures   DG Bone Density    Standing Status:   Future    Standing Expiration Date:   05/13/2022    Order Specific Question:   Reason for Exam (SYMPTOM  OR DIAGNOSIS REQUIRED)    Answer:   baseline DEXA, do with next L mammo in 11/2021    Order Specific Question:   Is the patient pregnant?    Answer:   No    Order Specific Question:   Preferred imaging location?    Answer:   [facility]   MR BREAST BILATERAL [facility] [place] [facility]    Standing Status:   Future    Standing Expiration Date:   05/13/2022    Order Specific Question:   If indicated for the ordered procedure, I authorize the administration of contrast media per Radiology protocol    Answer:   Yes    Order Specific Question:   What is the patient's sedation requirement?    Answer:   No Sedation    Order Specific Question:   Does the patient have a pacemaker or  implanted devices?    Answer:   No    Order Specific Question:   Preferred imaging location?    Answer:   [facility] (table limit - 550 lbs)   Ambulatory referral to Neurology    Referral Priority:   Routine    Referral Type:   Consultation    Referral Reason:   Specialty Services Required    Referred to Provider:   Marcial Pacas, MD    Requested Specialty:   Neurology    Number of Visits Requested:   1   Ambulatory referral to Internal Medicine    Referral Priority:   Routine    Referral Type:   Consultation    Referral Reason:   Specialty Services Required    Requested Specialty:   Internal Medicine    Number of Visits Requested:   1   Amb Ref to Medical Weight Management    Referral Priority:   Routine    Referral Type:   Consultation    Number of Visits Requested:   1   All questions were answered. The patient knows to call the clinic with any problems, questions or concerns. No barriers to learning were detected. Total encounter time was 40 minutes.      Alla Feeling, NP 05/13/21

## 2021-05-13 ENCOUNTER — Inpatient Hospital Stay (HOSPITAL_BASED_OUTPATIENT_CLINIC_OR_DEPARTMENT_OTHER): Payer: BC Managed Care – PPO | Admitting: Nurse Practitioner

## 2021-05-13 ENCOUNTER — Other Ambulatory Visit: Payer: Self-pay

## 2021-05-13 ENCOUNTER — Encounter: Payer: Self-pay | Admitting: Nurse Practitioner

## 2021-05-13 ENCOUNTER — Inpatient Hospital Stay: Payer: BC Managed Care – PPO | Attending: Nurse Practitioner

## 2021-05-13 VITALS — BP 122/70 | HR 94 | Resp 18 | Wt 265.4 lb

## 2021-05-13 DIAGNOSIS — D7589 Other specified diseases of blood and blood-forming organs: Secondary | ICD-10-CM | POA: Insufficient documentation

## 2021-05-13 DIAGNOSIS — Z8541 Personal history of malignant neoplasm of cervix uteri: Secondary | ICD-10-CM | POA: Diagnosis not present

## 2021-05-13 DIAGNOSIS — R29818 Other symptoms and signs involving the nervous system: Secondary | ICD-10-CM | POA: Diagnosis not present

## 2021-05-13 DIAGNOSIS — N951 Menopausal and female climacteric states: Secondary | ICD-10-CM | POA: Insufficient documentation

## 2021-05-13 DIAGNOSIS — E559 Vitamin D deficiency, unspecified: Secondary | ICD-10-CM | POA: Diagnosis not present

## 2021-05-13 DIAGNOSIS — Z8049 Family history of malignant neoplasm of other genital organs: Secondary | ICD-10-CM | POA: Insufficient documentation

## 2021-05-13 DIAGNOSIS — M797 Fibromyalgia: Secondary | ICD-10-CM | POA: Insufficient documentation

## 2021-05-13 DIAGNOSIS — Z170 Estrogen receptor positive status [ER+]: Secondary | ICD-10-CM

## 2021-05-13 DIAGNOSIS — Z79899 Other long term (current) drug therapy: Secondary | ICD-10-CM | POA: Insufficient documentation

## 2021-05-13 DIAGNOSIS — Z87891 Personal history of nicotine dependence: Secondary | ICD-10-CM | POA: Insufficient documentation

## 2021-05-13 DIAGNOSIS — C50411 Malignant neoplasm of upper-outer quadrant of right female breast: Secondary | ICD-10-CM

## 2021-05-13 DIAGNOSIS — F32A Depression, unspecified: Secondary | ICD-10-CM | POA: Diagnosis not present

## 2021-05-13 DIAGNOSIS — Z9011 Acquired absence of right breast and nipple: Secondary | ICD-10-CM | POA: Insufficient documentation

## 2021-05-13 DIAGNOSIS — F419 Anxiety disorder, unspecified: Secondary | ICD-10-CM | POA: Insufficient documentation

## 2021-05-13 DIAGNOSIS — Z79811 Long term (current) use of aromatase inhibitors: Secondary | ICD-10-CM | POA: Diagnosis not present

## 2021-05-13 LAB — CBC WITH DIFFERENTIAL (CANCER CENTER ONLY)
Abs Immature Granulocytes: 0.03 10*3/uL (ref 0.00–0.07)
Basophils Absolute: 0 10*3/uL (ref 0.0–0.1)
Basophils Relative: 0 %
Eosinophils Absolute: 0.1 10*3/uL (ref 0.0–0.5)
Eosinophils Relative: 2 %
HCT: 43.7 % (ref 36.0–46.0)
Hemoglobin: 14.6 g/dL (ref 12.0–15.0)
Immature Granulocytes: 0 %
Lymphocytes Relative: 25 %
Lymphs Abs: 2.1 10*3/uL (ref 0.7–4.0)
MCH: 33.8 pg (ref 26.0–34.0)
MCHC: 33.4 g/dL (ref 30.0–36.0)
MCV: 101.2 fL — ABNORMAL HIGH (ref 80.0–100.0)
Monocytes Absolute: 0.5 10*3/uL (ref 0.1–1.0)
Monocytes Relative: 6 %
Neutro Abs: 5.4 10*3/uL (ref 1.7–7.7)
Neutrophils Relative %: 67 %
Platelet Count: 237 10*3/uL (ref 150–400)
RBC: 4.32 MIL/uL (ref 3.87–5.11)
RDW: 12.6 % (ref 11.5–15.5)
WBC Count: 8.2 10*3/uL (ref 4.0–10.5)
nRBC: 0 % (ref 0.0–0.2)

## 2021-05-13 LAB — CMP (CANCER CENTER ONLY)
ALT: 36 U/L (ref 0–44)
AST: 22 U/L (ref 15–41)
Albumin: 4.2 g/dL (ref 3.5–5.0)
Alkaline Phosphatase: 91 U/L (ref 38–126)
Anion gap: 10 (ref 5–15)
BUN: 15 mg/dL (ref 6–20)
CO2: 24 mmol/L (ref 22–32)
Calcium: 10 mg/dL (ref 8.9–10.3)
Chloride: 107 mmol/L (ref 98–111)
Creatinine: 0.72 mg/dL (ref 0.44–1.00)
GFR, Estimated: >60 mL/min (ref >60)
Glucose, Bld: 108 mg/dL — ABNORMAL HIGH (ref 70–99)
Potassium: 4.5 mmol/L (ref 3.5–5.1)
Sodium: 141 mmol/L (ref 135–145)
Total Bilirubin: 0.5 mg/dL (ref 0.3–1.2)
Total Protein: 7.1 g/dL (ref 6.5–8.1)

## 2021-05-13 MED ORDER — VENLAFAXINE HCL 37.5 MG PO TABS: 37.5000 mg | ORAL_TABLET | Freq: Every day | ORAL | 1 refills | Status: DC

## 2021-05-18 ENCOUNTER — Encounter: Payer: Self-pay | Admitting: Nurse Practitioner

## 2021-05-26 ENCOUNTER — Ambulatory Visit (HOSPITAL_COMMUNITY)
Admission: RE | Admit: 2021-05-26 | Discharge: 2021-05-26 | Disposition: A | Discharge status: Home / Self Care | Payer: BC Managed Care – PPO | Source: Ambulatory Visit | Attending: Nurse Practitioner | Admitting: Nurse Practitioner

## 2021-05-26 ENCOUNTER — Ambulatory Visit (HOSPITAL_COMMUNITY): Admission: RE | Admit: 2021-05-26 | Payer: BC Managed Care – PPO | Source: Ambulatory Visit

## 2021-05-26 ENCOUNTER — Other Ambulatory Visit: Payer: Self-pay

## 2021-05-26 ENCOUNTER — Encounter (HOSPITAL_COMMUNITY): Payer: Self-pay

## 2021-05-26 DIAGNOSIS — C50411 Malignant neoplasm of upper-outer quadrant of right female breast: Secondary | ICD-10-CM | POA: Diagnosis not present

## 2021-05-26 DIAGNOSIS — Z170 Estrogen receptor positive status [ER+]: Secondary | ICD-10-CM | POA: Insufficient documentation

## 2021-05-26 IMAGING — MR MR BREAST BILAT WO/W CM
6 of 9 series · 29 of 48 positions shown · IV contrast (10 ML GADAVIST)
Comparison: Breast MRI dated [DATE]. LEFT breast screening
mammogram dated [DATE].

CLINICAL DATA: History of RIGHT breast cancer status post
mastectomy in [09].

LABS:  Not performed at imaging site.
EXAM:
BILATERAL BREAST MRI WITH AND WITHOUT CONTRAST
TECHNIQUE: Multiplanar, multisequence MR images of both breasts were obtained
prior to and following the intravenous administration of 10 ml of
Gadavist

[Series 2: T2 · axial · 3.0mm · 0.94mm/px · 1 of 69 slices shown]
[im 1/69]
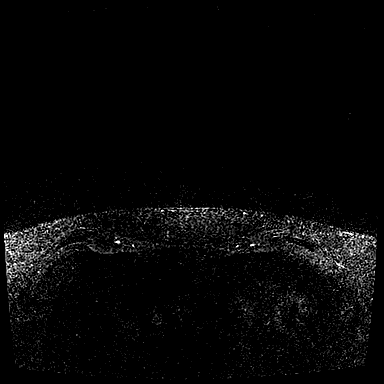

[Series 3: T1 fat-sat · axial · 1.2mm · 0.80mm/px · z∈[-92,+118]mm · 5 of 176 slices shown (1 of 4)]
[im 1/176]
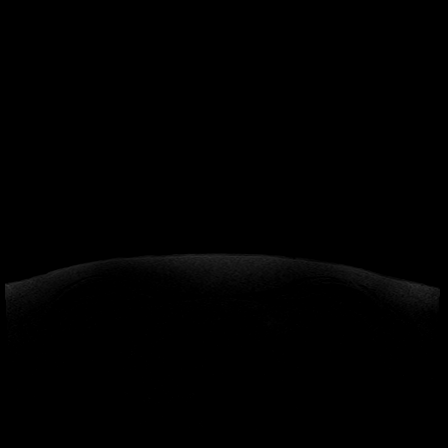
[im 44/176]
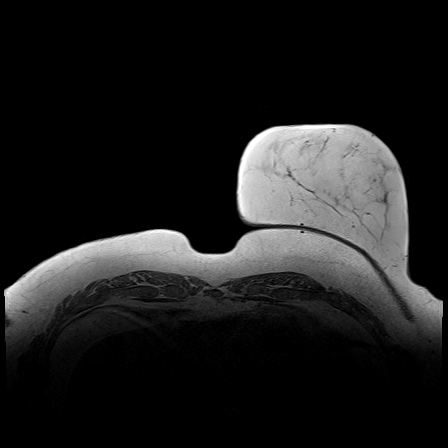
[im 88/176]
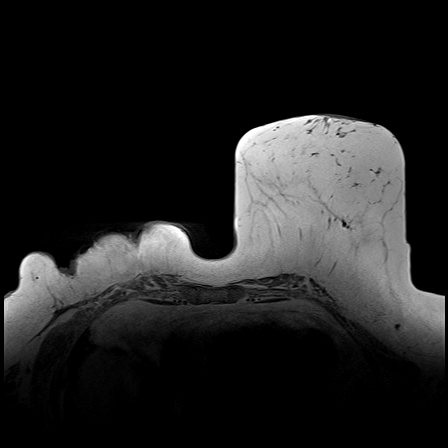
[im 132/176]
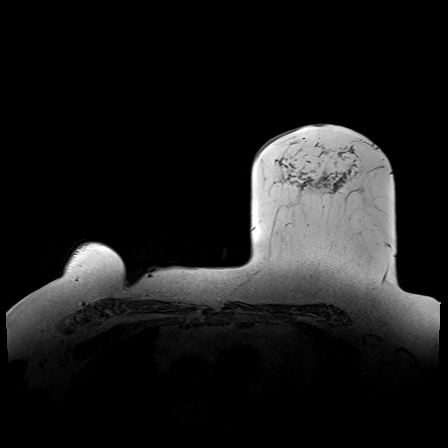
[im 176/176]
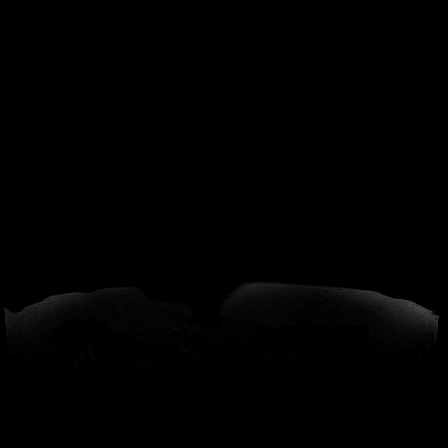

[Series 7: T1 fat-sat · axial · 1.2mm · 0.87mm/px · z∈[-105,+105]mm · 6 of 176 slices shown (2 of 4)]
[im 1/176]
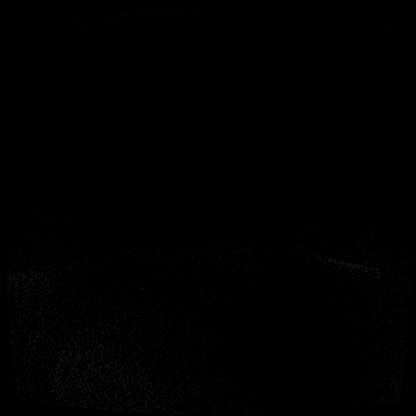
[im 36/176]
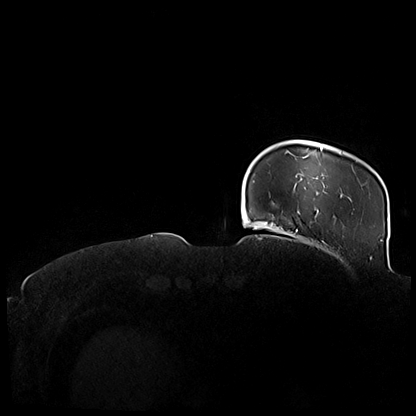
[im 71/176]
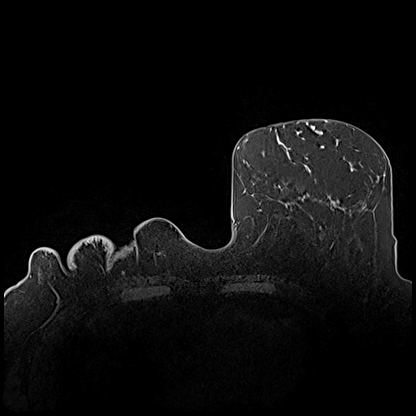
[im 106/176]
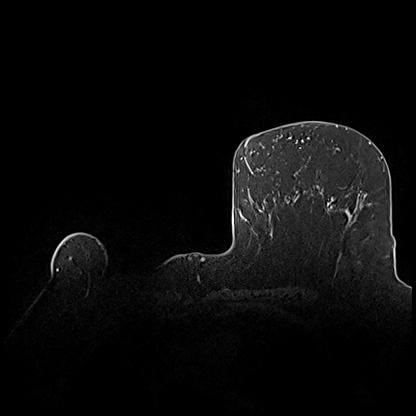
[im 141/176]
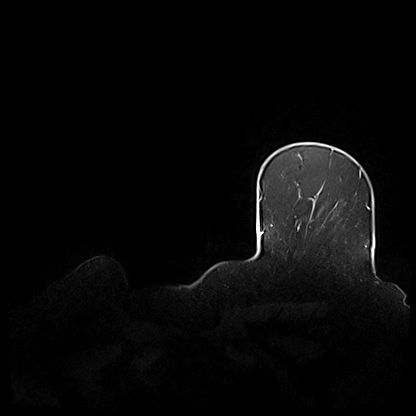
[im 176/176]
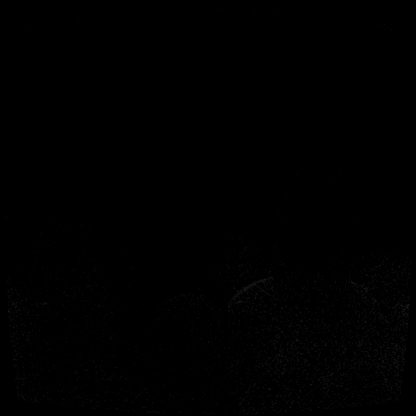

[Series 8: T1 fat-sat · axial · 1.2mm · 0.87mm/px · z∈[-105,+105]mm · 6 of 176 slices shown (3 of 4)]
[im 1/176]
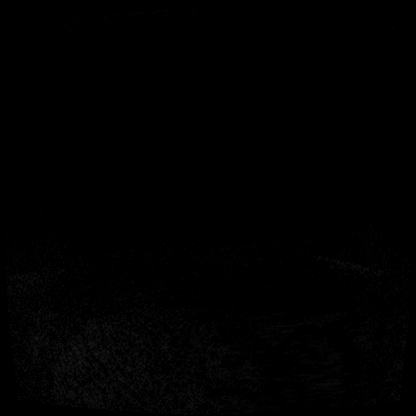
[im 36/176]
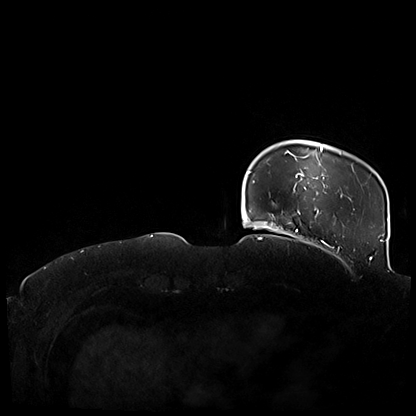
[im 71/176]
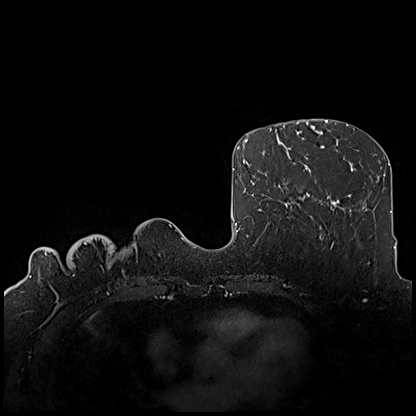
[im 106/176]
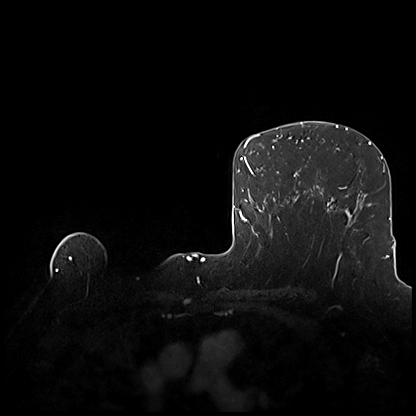
[im 141/176]
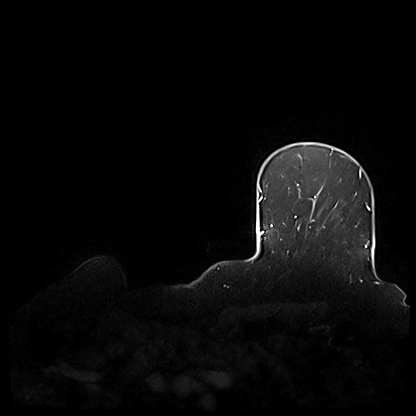
[im 176/176]
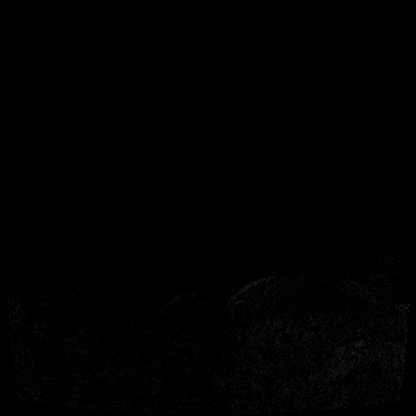

[Series 9: T1 · axial · 1.2mm · 0.87mm/px · z∈[-105,+105]mm · 6 of 176 slices shown]
[im 1/176]
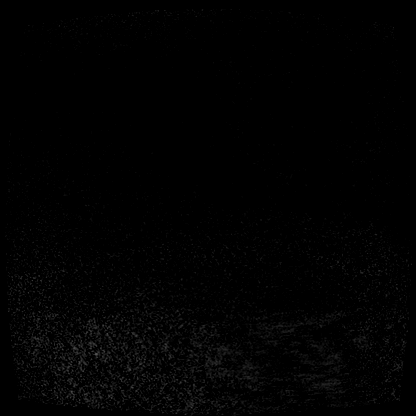
[im 36/176]
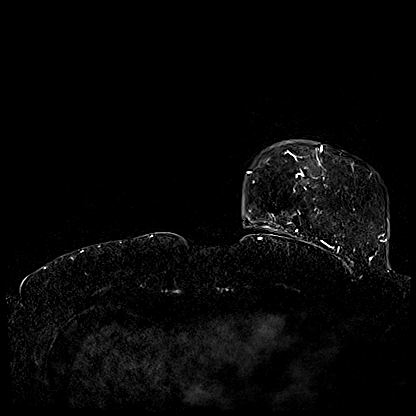
[im 71/176]
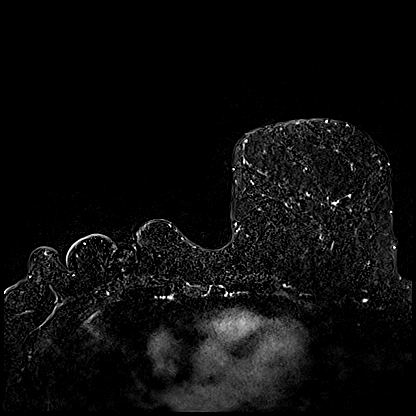
[im 106/176]
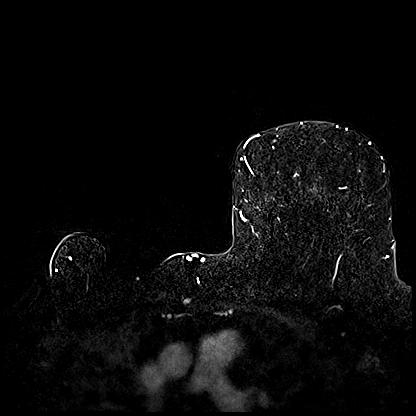
[im 141/176]
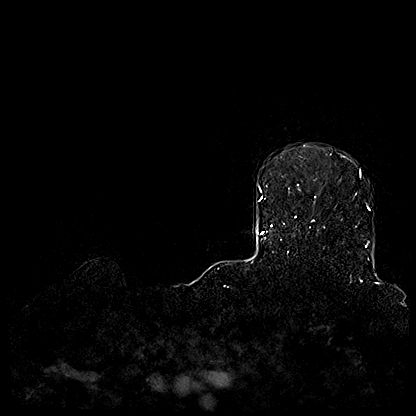
[im 176/176]
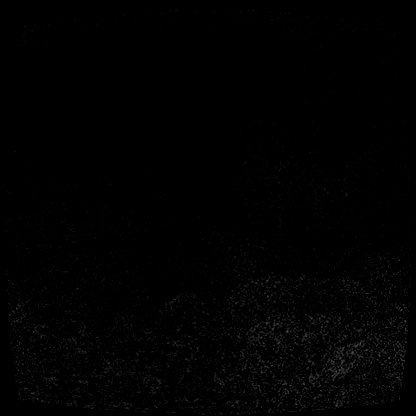

[Series 12: T1 fat-sat · axial · 1.2mm · 0.87mm/px · z∈[-105,+63]mm · 5 of 176 slices shown (4 of 4)]
[im 1/176]
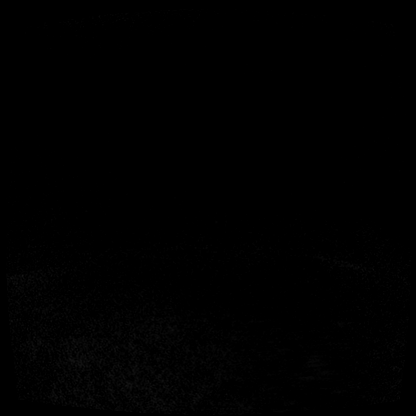
[im 36/176]
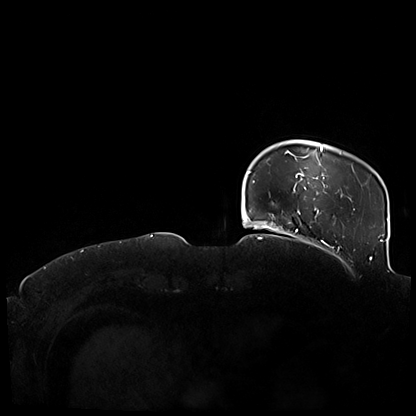
[im 71/176]
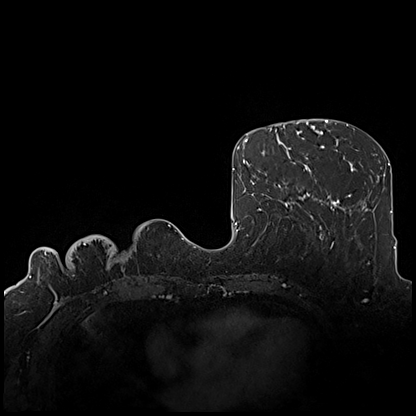
[im 106/176]
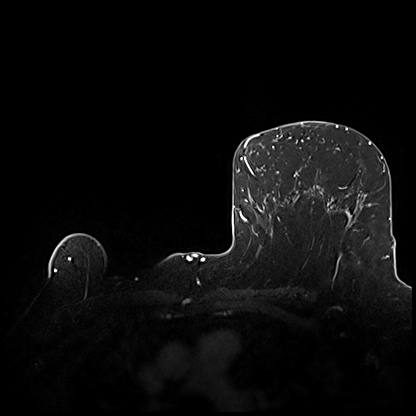
[im 141/176]
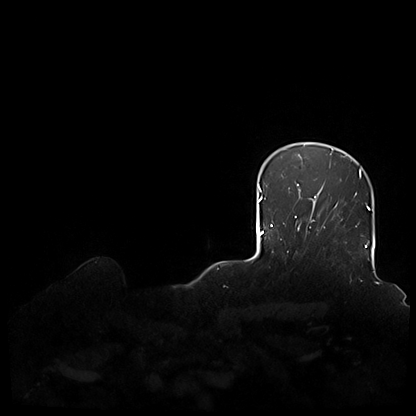

[29 of 48 positions shown; findings below may reference images not displayed]

Three-dimensional MR images were rendered by post-processing of the
original MR data on an independent workstation. The
three-dimensional MR images were interpreted, and findings are
reported in the following complete MRI report for this study. Three
dimensional images were evaluated at the independent interpreting
workstation using the DynaCAD thin client.
FINDINGS: Breast composition: b. Scattered fibroglandular tissue.

Background parenchymal enhancement: Mild

Right chest wall: No mass or abnormal enhancement.

Left breast: No suspicious enhancing mass, suspicious non-mass
enhancement or secondary signs of malignancy in the LEFT breast.

Lymph nodes: No abnormal appearing lymph nodes.

Ancillary findings:  None.
IMPRESSION: No MRI evidence of malignancy.

RECOMMENDATION:
1. Annual screening mammograms. Next LEFT breast screening mammogram
will be due in [DATE].
2. Given patient's personal history of breast cancer, would consider
the addition of annual screening breast MRI to annual screening
mammography. Per American Cancer Society guidelines, annual
screening MRI of the breasts is recommended if a risk assessment
calculation for breast cancer, preferably using the Tyrer-Cuzick or
Gail model, measures greater than 20%.

BI-RADS CATEGORY  1: Negative.

## 2021-05-26 MED ORDER — GADOBUTROL 1 MMOL/ML IV SOLN
10.0000 mL | Freq: Once | INTRAVENOUS | Status: AC | PRN
  Administered 2021-05-26: 10 mL via INTRAVENOUS

## 2021-05-29 ENCOUNTER — Other Ambulatory Visit: Payer: Self-pay | Admitting: Nurse Practitioner

## 2021-05-29 DIAGNOSIS — C50411 Malignant neoplasm of upper-outer quadrant of right female breast: Secondary | ICD-10-CM

## 2021-05-29 DIAGNOSIS — Z170 Estrogen receptor positive status [ER+]: Secondary | ICD-10-CM

## 2021-05-29 DIAGNOSIS — Z13820 Encounter for screening for osteoporosis: Secondary | ICD-10-CM

## 2021-06-23 ENCOUNTER — Other Ambulatory Visit: Payer: Self-pay

## 2021-06-23 ENCOUNTER — Ambulatory Visit (INDEPENDENT_AMBULATORY_CARE_PROVIDER_SITE_OTHER): Payer: BC Managed Care – PPO | Admitting: Neurology

## 2021-06-23 ENCOUNTER — Encounter: Payer: Self-pay | Admitting: Neurology

## 2021-06-23 DIAGNOSIS — R413 Other amnesia: Secondary | ICD-10-CM

## 2021-06-23 DIAGNOSIS — R531 Weakness: Secondary | ICD-10-CM

## 2021-06-23 DIAGNOSIS — R52 Pain, unspecified: Secondary | ICD-10-CM

## 2021-06-23 MED ORDER — DULOXETINE HCL 60 MG PO CPEP: 60.0000 mg | ORAL_CAPSULE | Freq: Every day | ORAL | 11 refills | Status: DC

## 2021-06-23 NOTE — Progress Notes (Signed)
Chief Complaint  Patient presents with   New Patient (Initial Visit)    NP/internal referral for leg weakness, muscle spasms, loss of balance, recent fall, eye twitching, tongue tingling Rm 15, with husband       ASSESSMENT AND PLAN  Cynthia Shaw is a 60 y.o. female    Constellation of complaints, including generalized weakness, body achy pain, brain foggy sensation, In the setting of depression anxiety,  She desire further evaluation, MRI of the brain to rule out structural abnormality  Laboratory evaluation to rule out treatable etiology such as inflammatory, infectious, nutritional deficiency   Try Cymbalta 60 mg daily   DIAGNOSTIC DATA (LABS, IMAGING, TESTING) - I reviewed patient records, labs, notes, testing and imaging myself where available.   MEDICAL HISTORY:  Cynthia Shaw, is a 60 year old female seen in request by her primary care nurse practitioner Alla Feeling, for evaluation of relation of symptoms, including leg weakness, intermittent muscle spasm, initial evaluation was on June 23, 2021, she is accompanied by her husband at today's visit  I reviewed and summarized the referring note. PMHX Right breast Cancer, s/p lobectomy in July 2021,  HTN HLD.  Husband reported that patient has gone through very stressful few years, she lost her father in 2020, she denies significant depression or anxiety symptoms, but her husband endorse that she is feeling depression  She works as a Designer, multimedia, does not walk frequently at office, can stop [phone] steps a day sometimes, but sometimes she felt so weak, she has difficulty going down stairs, putting on her shoes,  Also complains of brain foggy sensation, lightheadedness, frequent dizziness, especially since October 2022, difficult to carry on a conversation, has trouble complete her train of thoughts,   PHYSICAL EXAM:   There were no vitals filed for this visit. Not recorded     There is no height or  weight on file to calculate BMI.  PHYSICAL EXAMNIATION:  Gen: NAD, conversant, well nourised, well groomed                     Cardiovascular: Regular rate rhythm, no peripheral edema, warm, nontender. Eyes: Conjunctivae clear without exudates or hemorrhage Neck: Supple, no carotid bruits. Pulmonary: Clear to auscultation bilaterally   NEUROLOGICAL EXAM:  MENTAL STATUS: Speech:    Speech is normal; fluent and spontaneous with normal comprehension.  Cognition:     Orientation to time, place and person     Normal recent and remote memory     Normal Attention span and concentration     Normal Language, naming, repeating,spontaneous speech     Fund of knowledge   CRANIAL NERVES: CN II: Visual fields are full to confrontation. Pupils are round equal and briskly reactive to light. CN III, IV, VI: extraocular movement are normal. No ptosis. CN V: Facial sensation is intact to light touch CN VII: Face is symmetric with normal eye closure  CN VIII: Hearing is normal to causal conversation. CN IX, X: Phonation is normal. CN XI: Head turning and shoulder shrug are intact  MOTOR: There is no pronator drift of out-stretched arms. Muscle bulk and tone are normal. Muscle strength is normal.  REFLEXES: Reflexes are 2+ and symmetric at the biceps, triceps, knees, and ankles. Plantar responses are flexor.  SENSORY: Intact to light touch, pinprick and vibratory sensation are intact in fingers and toes.  COORDINATION: There is no trunk or limb dysmetria noted.  GAIT/STANCE: Posture is normal. Gait is steady with normal steps,  base, arm swing, and turning. Heel and toe walking are normal. Tandem gait is normal.  Romberg is absent.  REVIEW OF SYSTEMS:  Full 14 system review of systems performed and notable only for as above All other review of systems were negative.   ALLERGIES: Allergies  Allergen Reactions   Tylenol With Codeine #3 [Acetaminophen-Codeine] Nausea And Vomiting    Sulfa Antibiotics Rash    HOME MEDICATIONS: Current Outpatient Medications  Medication Sig Dispense Refill   anastrozole (ARIMIDEX) 1 MG tablet Take 1 mg by mouth daily.     baclofen (LIORESAL) 10 MG tablet Take 10 mg by mouth 2 (two) times daily.     cyclobenzaprine (FLEXERIL) 10 MG tablet Take 10 mg by mouth at bedtime.     ibuprofen (ADVIL) 800 MG tablet Take by mouth.     lisinopril (ZESTRIL) 10 MG tablet Take 10 mg by mouth daily.     rosuvastatin (CRESTOR) 10 MG tablet Take 10 mg by mouth at bedtime.     No current facility-administered medications for this visit.    PAST MEDICAL HISTORY: Past Medical History:  Diagnosis Date   Anxiety    Cancer ([place]) 12/2019   right breast invasive lobular cancer   Depression    Family history of colon cancer    Family history of melanoma    Family history of throat cancer    Family history of uterine cancer    Hypertension     PAST SURGICAL HISTORY: Past Surgical History:  Procedure Laterality Date   c sections      x3   CESAREAN SECTION     KNEE ARTHROSCOPY Left    MASTECTOMY W/ SENTINEL NODE BIOPSY Right 02/22/2020   Procedure: RIGHT MASTECTOMY WITH SENTINEL LYMPH NODE BIOPSY;  Surgeon: Donnie Mesa, MD;  Location: [facility];  Service: General;  Laterality: Right;  PEC BLOCK   TONSILLECTOMY      FAMILY HISTORY: Family History  Problem Relation Age of Onset   Melanoma Mother 39   Uterine cancer Sister 10   Colon cancer Maternal Grandmother 60   Other Sister 81       precancerous cells of the breast   Throat cancer Paternal Great-grandfather        dx. in his 43s    SOCIAL HISTORY: Social History   Socioeconomic History   Marital status: Married    Spouse name: Not on file   Number of children: 3   Years of education: Not on file   Highest education level: Not on file  Occupational History   Not on file  Tobacco Use   Smoking status: Former    Packs/day: 0.50    Years: 8.00    Pack years:  4.00    Types: Cigarettes    Quit date: 12/28/2019    Years since quitting: 1.4   Smokeless tobacco: Never  Substance and Sexual Activity   Alcohol use: Yes    Comment: socially   Drug use: Never   Sexual activity: Yes    Birth control/protection: Post-menopausal  Other Topics Concern   Not on file  Social History Narrative   Not on file   Social Determinants of Health   Financial Resource Strain: Not on file  Food Insecurity: Not on file  Transportation Needs: Not on file  Physical Activity: Not on file  Stress: Not on file  Social Connections: Not on file  Intimate Partner Violence: Not on file      Marcial Pacas, M.D.  Ph.D.  [facility] [address] [place], [place] [address] Ph: [phone] Fax: [phone]  CC:  Alla Feeling, NP [place],  [place] [phone]  Alla Feeling, NP

## 2021-06-24 LAB — VITAMIN D 25 HYDROXY (VIT D DEFICIENCY, FRACTURES): Vit D, 25-Hydroxy: 20.3 ng/mL — ABNORMAL LOW (ref 30.0–100.0)

## 2021-06-24 LAB — ANA W/REFLEX IF POSITIVE: Anti Nuclear Antibody (ANA): NEGATIVE

## 2021-06-24 LAB — C-REACTIVE PROTEIN: CRP: 7 mg/L (ref 0–10)

## 2021-06-24 LAB — SEDIMENTATION RATE: Sed Rate: 8 mm/hr (ref 0–40)

## 2021-06-24 LAB — CK: Total CK: 61 U/L (ref 32–182)

## 2021-06-24 LAB — VITAMIN B12: Vitamin B-12: 574 pg/mL (ref 232–1245)

## 2021-06-24 LAB — RPR: RPR Ser Ql: NONREACTIVE

## 2021-06-24 LAB — TSH: TSH: 2.29 u[IU]/mL (ref 0.450–4.500)

## 2021-06-30 ENCOUNTER — Ambulatory Visit: Payer: BC Managed Care – PPO | Admitting: Neurology

## 2021-07-02 ENCOUNTER — Encounter: Payer: Self-pay | Admitting: Neurology

## 2021-07-02 NOTE — Telephone Encounter (Signed)
Patient is scheduled at [facility] for 07/08/21. Lorella Nimrod Josem Kaufmann: [phone] (exp. 07/02/21 to 07/31/21)

## 2021-07-08 ENCOUNTER — Ambulatory Visit: Payer: BC Managed Care – PPO

## 2021-07-08 DIAGNOSIS — R413 Other amnesia: Secondary | ICD-10-CM

## 2021-07-08 DIAGNOSIS — R531 Weakness: Secondary | ICD-10-CM

## 2021-07-08 MED ORDER — GADOBENATE DIMEGLUMINE 529 MG/ML IV SOLN
15.0000 mL | Freq: Once | INTRAVENOUS | Status: AC | PRN
  Administered 2021-07-08: 16:00:00 15 mL via INTRAVENOUS

## 2021-08-07 ENCOUNTER — Encounter: Payer: Self-pay | Admitting: *Deleted

## 2021-08-07 ENCOUNTER — Other Ambulatory Visit: Payer: Self-pay | Admitting: Hematology

## 2021-08-07 NOTE — Progress Notes (Signed)
Arimidex refill approved, as she is to continue therapy at this time.

## 2021-09-14 ENCOUNTER — Encounter: Payer: Self-pay | Admitting: *Deleted

## 2021-09-22 ENCOUNTER — Ambulatory Visit: Payer: BC Managed Care – PPO | Admitting: Adult Health

## 2021-10-26 ENCOUNTER — Telehealth: Payer: Self-pay | Admitting: Hematology

## 2021-10-26 NOTE — Telephone Encounter (Signed)
Left pt a message with reschedule date and times.  Left call back number is changes are need  ?

## 2021-11-10 ENCOUNTER — Ambulatory Visit (INDEPENDENT_AMBULATORY_CARE_PROVIDER_SITE_OTHER): Payer: Self-pay | Admitting: *Deleted

## 2021-11-10 VITALS — Ht 67.5 in | Wt 233.0 lb

## 2021-11-10 DIAGNOSIS — Z1211 Encounter for screening for malignant neoplasm of colon: Secondary | ICD-10-CM

## 2021-11-10 NOTE — Progress Notes (Addendum)
Gastroenterology Pre-Procedure Review ? ?Request Date: 11/10/2021 ?Requesting Physician: Dr. Manuella Ghazi, no previous TCS, family hx of colon cancer (grandmother) ? ?PATIENT REVIEW QUESTIONS: The patient responded to the following health history questions as indicated:   ? ?1. Diabetes Melitis: no ?2. Joint replacements in the past 12 months: no ?3. Major health problems in the past 3 months: no ?4. Has an artificial valve or MVP: no ?5. Has a defibrillator: no ?6. Has been advised in past to take antibiotics in advance of a procedure like teeth cleaning: no ?7. Family history of colon cancer: yes, grandmother: age 63  ?84. Alcohol Use: no ?9. Illicit drug Use: no ?10. History of sleep apnea: no  ?11. History of coronary artery or other vascular stents placed within the last 12 months: no ?12. History of any prior anesthesia complications: no ?13. Body mass index is 35.95 kg/m?. ?   ?MEDICATIONS & ALLERGIES:    ?Patient reports the following regarding taking any blood thinners:   ?Plavix? no ?Aspirin? no ?Coumadin? no ?Brilinta? no ?Xarelto? no ?Eliquis? no ?Pradaxa? no ?Savaysa? no ?Effient? no ? ?Patient confirms/reports the following medications:  ?Current Outpatient Medications  ?Medication Sig Dispense Refill  ? anastrozole (ARIMIDEX) 1 MG tablet TAKE 1 TABLET BY MOUTH EVERY DAY 90 tablet 0  ? Cholecalciferol (VITAMIN D3) 125 MCG (5000 UT) CAPS Take by mouth daily at 6 (six) AM.    ? Coenzyme Q10 (COQ10) 100 MG CAPS Take by mouth daily at 6 (six) AM.    ? cyclobenzaprine (FLEXERIL) 10 MG tablet Take 10 mg by mouth at bedtime.    ? diclofenac (VOLTAREN) 75 MG EC tablet Take 75 mg by mouth as needed.    ? ibuprofen (ADVIL) 800 MG tablet Take by mouth as needed.    ? lisinopril (ZESTRIL) 10 MG tablet Take 10 mg by mouth daily.    ? rosuvastatin (CRESTOR) 10 MG tablet Take 10 mg by mouth at bedtime.    ? venlafaxine (EFFEXOR) 75 MG tablet daily at 6 (six) AM.    ? ?No current facility-administered medications for this  visit.  ? ? ?Patient confirms/reports the following allergies:  ?Allergies  ?Allergen Reactions  ? Tylenol With Codeine #3 [Acetaminophen-Codeine] Nausea And Vomiting  ? Sulfa Antibiotics Rash  ? ? ?No orders of the defined types were placed in this encounter. ? ? ?AUTHORIZATION INFORMATION ?Primary Insurance: [place],  [place] #: [id number],  Group #: [id number] ?Pre-Cert / Josem Kaufmann required: No, not required ? ?SCHEDULE INFORMATION: ?Procedure has been scheduled as follows:  ?Date: 01/18/2022, Time: 8:15 ?Location: [facility] with Dr. Gala Romney ? ?This Gastroenterology Pre-Precedure Review Form is being routed to the following provider(s): Neil Crouch, PA-C ?  ?

## 2021-11-11 ENCOUNTER — Other Ambulatory Visit: Payer: BC Managed Care – PPO

## 2021-11-11 ENCOUNTER — Ambulatory Visit: Payer: BC Managed Care – PPO | Admitting: Hematology

## 2021-11-16 ENCOUNTER — Ambulatory Visit: Payer: BC Managed Care – PPO

## 2021-11-20 NOTE — Progress Notes (Signed)
Ok to schedule. ASA 2.  

## 2021-11-20 NOTE — Progress Notes (Signed)
Pt requested June procedure.  Informed her that I would call her once June procedure schedules are available. ?

## 2021-11-22 ENCOUNTER — Other Ambulatory Visit: Payer: Self-pay | Admitting: Nurse Practitioner

## 2021-11-22 DIAGNOSIS — Z170 Estrogen receptor positive status [ER+]: Secondary | ICD-10-CM

## 2021-11-22 NOTE — Progress Notes (Deleted)
Cynthia Shaw   [phone] [phone]   Clinic Follow up Note   Patient Care Team: Alla Feeling, NP as PCP - General (Nurse Practitioner) Rockwell Germany, RN as Oncology Nurse Navigator Mauro Kaufmann, RN as Oncology Nurse Navigator Donnie Mesa, MD as Consulting Physician (General Surgery) Truitt Merle, MD as Consulting Physician (Hematology) Kyung Rudd, MD as Consulting Physician (Radiation Oncology) Alla Feeling, NP as Nurse Practitioner (Nurse Practitioner) 11/22/2021  CHIEF COMPLAINT: Follow up right breast cancer   SUMMARY OF ONCOLOGIC HISTORY: Oncology History Overview Note  Cancer Staging Malignant neoplasm of upper-outer quadrant of right breast in female, estrogen receptor positive ([place]) Staging form: Breast, AJCC 8th Edition - Clinical stage from 12/26/2019: Stage IB (cT2, cN0, cM0, G2, ER+, PR+, HER2-) - Unsigned - Pathologic stage from 02/22/2020: Stage IA (pT2, pN0, cM0, G2, ER+, PR+, HER2-) - Signed by Alla Feeling, NP on 06/09/2020    Malignant neoplasm of upper-outer quadrant of right breast in female, estrogen receptor positive ([place])  12/20/2019 Initial Biopsy   Diagnosis 1. Breast, right, needle core biopsy, 7:30 o'clock, 4 cmfn, ribbon clip - FIBROCYSTIC CHANGES. - THERE IS NO EVIDENCE OF MALIGNANCY. - SEE COMMENT. 2. Breast, right, needle core biopsy, 9 o'clock, 5 cmfn, coil clip - INVASIVE MAMMARY CARCINOMA. - SEE COMMENT. 3. Breast, right, needle core biopsy, outer, x clip - INVASIVE MAMMARY CARCINOMA. - ATYPICAL LOBULAR HYPERPLASIA WITH CALCIFICATIONS. - SEE COMMENT. Microscopic Comment 2. The carcinoma appears at least grade 2. The longest span of tumor is 1.2 cm. An E-Cadherin and a breast prognostic profile will be performed on part 2 and the results reported separately. Dr. Jeannie Done has reviewed part 2 and concurs with this interpretation. The results were called to [facility] on  12/21/2019. 3. The carcinoma in part 3 is morphologically dissimilar from that in part 2. The longest span of tumor is 0.1 cm. An E-Cadherin and a breast prognostic profile will also be performed on part 3 and the results reported separately. (JBK:ah 12/21/19)   12/20/2019 Receptors her2   2. PROGNOSTIC INDICATORS Results: IMMUNOHISTOCHEMICAL AND MORPHOMETRIC ANALYSIS PERFORMED MANUALLY The tumor cells are NEGATIVE for Her2 (1+). Estrogen Receptor: 95%, POSITIVE, STRONG STAINING INTENSITY Progesterone Receptor: 95%, POSITIVE, STRONG STAINING INTENSITY Proliferation Marker Ki67: 15%   3. Prognostic indicators:  HER2 NEGATIVE  ER: 95% POSITIVE STRONG STAINING PR 90% POSITIVE STRONG STAINING  Proliferation Marker Ki67: 20%   12/20/2019 Mammogram   Diagnostic Mammogram  IMPRESSION    12/25/2019 Initial Diagnosis   Malignant neoplasm of upper-outer quadrant of right breast in female, estrogen receptor positive ([place])   12/29/2019 Breast MRI   IMPRESSION: 1. 2.5 cm spiculated mass in the upper outer right breast at middle depth consistent with the patient's biopsy-proven malignancy (coil clip). 2. Additional 9 mm enhancing mass located approximately 3 cm inferior to the index lesion. This is seen adjacent to a biopsy tract in the central right breast and may represents the additional biopsied focus of malignancy. The associated X-shaped clip was displaced 4 cm medially at the time of biopsy. 3. No MRI evidence of malignancy on the left. 4. No suspicious lymphadenopathy.   01/02/2020 Genetic Testing   Negative genetic testing:  No pathogenic variants detected on the [facility] Breast Cancer STAT panel or the Common Hereditary Cancers panel. The report dates are 01/02/2020 and 01/08/2020, respectively.  The Breast Cancer STAT Panel offered by [facility] includes sequencing and deletion/duplication analysis for the following 9  genes:  ATM, BRCA1, BRCA2, CDH1, CHEK2, PALB2, PTEN, STK11 and TP53. The  Common Hereditary Cancers Panel offered by [facility] includes sequencing and/or deletion duplication testing of the following 47 genes: APC, ATM, AXIN2, BARD1, BMPR1A, BRCA1, BRCA2, BRIP1, CDH1, CDK4, CDKN2A (p14ARF), CDKN2A (p16INK4a), CHEK2, CTNNA1, DICER1, EPCAM (Deletion/duplication testing only), GREM1 (promoter region deletion/duplication testing only), KIT, MEN1, MLH1, MSH2, MSH3, MSH6, MUTYH, NBN, NF1, NTHL1, PALB2, PDGFRA, PMS2, POLD1, POLE, PTEN, RAD50, RAD51C, RAD51D, SDHB, SDHC, SDHD, SMAD4, SMARCA4. STK11, TP53, TSC1, TSC2, and VHL.  The following genes were evaluated for sequence changes only: SDHA and HOXB13 c.251G>A variant only.   01/16/2020 Pathology Results   Diagnosis Breast, right, needle core biopsy, mid to post 1/3 out mid below known cancer - INVASIVE AND IN SITU LOBULAR CARCINOMA. - SEE MICROSCOPIC DESCRIPTION. Microscopic Comment The greatest dimension in a single core is 0.8 cm.   01/22/2020 -  Neo-Adjuvant Anti-estrogen oral therapy   Exemestane 32m once daily starting in 01/22/20   02/22/2020 Surgery   RIGHT MASTECTOMY WITH SENTINEL LYMPH NODE BIOPSY   02/22/2020 Pathology Results   FINAL MICROSCOPIC DIAGNOSIS:   A. BREAST, RIGHT, MASTECTOMY:  - Invasive lobular carcinoma, grade 2, spanning 4.1 cm.  - Lobular carcinoma in situ.  - Anterior margin is focally positive for invasive carcinoma.  - Perineural invasion.  - Lymphovascular invasion.  - See oncology table.   B. LYMPH NODE, RIGHT AXILLARY #1, SENTINEL, BIOPSY:  - One of one lymph nodes negative for carcinoma (0/1).   C. SKIN, RIGHT MASTECTOMY ADDITIONAL INFERIOR FLAP, EXCISION:  - Focal invasive lobular carcinoma, 2 mm, associated with lymphovascular  invasion.  - Margin is negative for carcinoma.    02/22/2020 Oncotype testing   RS 23  Distant risk of recurrence 9% Less than 1% benefit of chemotherapy   02/22/2020 Cancer Staging   Staging form: Breast, AJCC 8th Edition - Pathologic stage from  02/22/2020: Stage IA (pT2, pN0, cM0, G2, ER+, PR+, HER2-) - Signed by BAlla Feeling NP on 06/09/2020    08/08/2020 Survivorship   SCP mailed to patient 08/08/2020     CURRENT THERAPY: Exemestane 25 mg once daily starting 12/2019  INTERVAL HISTORY: Ms. NCoulibalyreturns for follow up as scheduled. Last seen by me 05/13/21. Screening breast MRI 05/26/21 was negative. I referred her to neurologist Dr. YKrista Blue work up showed unremarkable brain MRI and labs except low vit D of 20.    REVIEW OF SYSTEMS:   Constitutional: Denies fevers, chills or abnormal weight loss Eyes: Denies blurriness of vision Ears, nose, mouth, throat, and face: Denies mucositis or sore throat Respiratory: Denies cough, dyspnea or wheezes Cardiovascular: Denies palpitation, chest discomfort or lower extremity swelling Gastrointestinal:  Denies nausea, heartburn or change in bowel habits Skin: Denies abnormal skin rashes Lymphatics: Denies new lymphadenopathy or easy bruising Neurological:Denies numbness, tingling or new weaknesses Behavioral/Psych: Mood is stable, no new changes  All other systems were reviewed with the patient and are negative.  MEDICAL HISTORY:  Past Medical History:  Diagnosis Date   Anxiety    [place]   right breast invasive lobular cancer   Depression    Family history of colon cancer    Family history of melanoma    Family history of throat cancer    Family history of uterine cancer    Hypertension     SURGICAL HISTORY: Past Surgical History:  Procedure Laterality Date   c sections      x3   CESAREAN SECTION  KNEE ARTHROSCOPY Left    MASTECTOMY W/ SENTINEL NODE BIOPSY Right 02/22/2020   Procedure: RIGHT MASTECTOMY WITH SENTINEL LYMPH NODE BIOPSY;  Surgeon: Donnie Mesa, MD;  Location: [facility];  Service: General;  Laterality: Right;  PEC BLOCK   TONSILLECTOMY      I have reviewed the social history and family history with the patient and they are  unchanged from previous note.  ALLERGIES:  is allergic to tylenol with codeine #3 [acetaminophen-codeine] and sulfa antibiotics.  MEDICATIONS:  Current Outpatient Medications  Medication Sig Dispense Refill   anastrozole (ARIMIDEX) 1 MG tablet TAKE 1 TABLET BY MOUTH EVERY DAY 90 tablet 0   Cholecalciferol (VITAMIN D3) 125 MCG (5000 UT) CAPS Take by mouth daily at 6 (six) AM.     Coenzyme Q10 (COQ10) 100 MG CAPS Take by mouth daily at 6 (six) AM.     cyclobenzaprine (FLEXERIL) 10 MG tablet Take 10 mg by mouth at bedtime.     diclofenac (VOLTAREN) 75 MG EC tablet Take 75 mg by mouth as needed.     ibuprofen (ADVIL) 800 MG tablet Take by mouth as needed.     lisinopril (ZESTRIL) 10 MG tablet Take 10 mg by mouth daily.     rosuvastatin (CRESTOR) 10 MG tablet Take 10 mg by mouth at bedtime.     venlafaxine (EFFEXOR) 75 MG tablet daily at 6 (six) AM.     No current facility-administered medications for this visit.    PHYSICAL EXAMINATION: ECOG PERFORMANCE STATUS: {CHL ONC ECOG [phone]}  There were no vitals filed for this visit. There were no vitals filed for this visit.  GENERAL:alert, no distress and comfortable SKIN: skin color, texture, turgor are normal, no rashes or significant lesions EYES: normal, Conjunctiva are pink and non-injected, sclera clear OROPHARYNX:no exudate, no erythema and lips, buccal mucosa, and tongue normal  NECK: supple, thyroid normal size, non-tender, without nodularity LYMPH:  no palpable lymphadenopathy in the cervical, axillary or inguinal LUNGS: clear to auscultation and percussion with normal breathing effort HEART: regular rate & rhythm and no murmurs and no lower extremity edema ABDOMEN:abdomen soft, non-tender and normal bowel sounds Musculoskeletal:no cyanosis of digits and no clubbing  NEURO: alert & oriented x 3 with fluent speech, no focal motor/sensory deficits  LABORATORY DATA:  I have reviewed the data as listed    Latest Ref Rng &  Units 05/13/2021    9:31 AM 10/08/2020    9:05 AM 12/26/2019   12:08 PM  CBC  WBC 4.0 - 10.5 K/uL 8.2   7.2   8.0    Hemoglobin 12.0 - 15.0 g/dL 14.6   15.0   15.1    Hematocrit 36.0 - 46.0 % 43.7   45.7   44.8    Platelets 150 - 400 K/uL 237   237   272          Latest Ref Rng & Units 05/13/2021    9:31 AM 10/08/2020    9:05 AM 12/26/2019   12:08 PM  CMP  Glucose 70 - 99 mg/dL 108   105   100    BUN 6 - 20 mg/dL '15   13   17    ' Creatinine 0.44 - 1.00 mg/dL 0.72   0.79   0.94    Sodium 135 - 145 mmol/L 141   138   142    Potassium 3.5 - 5.1 mmol/L 4.5   4.5   4.2    Chloride 98 - 111  mmol/L 107   105   106    CO2 22 - 32 mmol/L '24   26   26    ' Calcium 8.9 - 10.3 mg/dL 10.0   9.8   10.4    Total Protein 6.5 - 8.1 g/dL 7.1   7.1   7.6    Total Bilirubin 0.3 - 1.2 mg/dL 0.5   0.5   0.6    Alkaline Phos 38 - 126 U/L 91   70   67    AST 15 - 41 U/L '22   17   30    ' ALT 0 - 44 U/L 36   27   53        RADIOGRAPHIC STUDIES: I have personally reviewed the radiological images as listed and agreed with the findings in the report. No results found.   ASSESSMENT & PLAN:  No problem-specific Assessment & Plan notes found for this encounter.   No orders of the defined types were placed in this encounter.  All questions were answered. The patient knows to call the clinic with any problems, questions or concerns. No barriers to learning was detected. I spent {CHL ONC TIME VISIT - HKISN:[id number]} counseling the patient face to face. The total time spent in the appointment was {CHL ONC TIME VISIT - GJGML:[id number]} and more than 50% was on counseling and review of test results     Alla Feeling, NP 11/22/21

## 2021-11-24 ENCOUNTER — Inpatient Hospital Stay: Payer: BC Managed Care – PPO

## 2021-11-24 ENCOUNTER — Inpatient Hospital Stay: Payer: BC Managed Care – PPO | Admitting: Nurse Practitioner

## 2021-11-30 ENCOUNTER — Other Ambulatory Visit: Payer: Self-pay | Admitting: Hematology

## 2021-11-30 ENCOUNTER — Other Ambulatory Visit: Payer: Self-pay

## 2021-12-01 ENCOUNTER — Other Ambulatory Visit: Payer: Self-pay

## 2021-12-02 ENCOUNTER — Encounter: Payer: Self-pay | Admitting: Nurse Practitioner

## 2021-12-02 ENCOUNTER — Other Ambulatory Visit: Payer: Self-pay

## 2021-12-04 NOTE — Progress Notes (Signed)
12/04/2021-Lmom for pt to call me back. ?

## 2021-12-07 ENCOUNTER — Encounter: Payer: Self-pay | Admitting: *Deleted

## 2021-12-07 MED ORDER — NA SULFATE-K SULFATE-MG SULF 17.5-3.13-1.6 GM/177ML PO SOLN: 1.0000 | Freq: Once | ORAL | 0 refills | Status: AC

## 2021-12-07 NOTE — Addendum Note (Signed)
Addended by: Metro Kung on: 12/07/2021 12:28 PM ? ? Modules accepted: Orders ? ?

## 2021-12-07 NOTE — Progress Notes (Signed)
Spoke to pt.  Scheduled procedure for 01/18/2022 at 8:15, arrival 6:45 at [facility].  Reviewed prep instructions with pt by phone.  Pt made aware to pick up prep kit at pharmacy.  Confirmed mailing address and mailed instructions. ?

## 2021-12-09 ENCOUNTER — Other Ambulatory Visit: Payer: Self-pay

## 2021-12-09 NOTE — Progress Notes (Signed)
Pt was switched from Exemestine to Anastrozole d/t pt's insurance's preferred drug was Anastrozole d/t cost.  Pt was switched to Anastrozole in August of 2022.  Pt also continued to take Effexor-XR for side effects of antiestrogen therapy. ?

## 2021-12-12 NOTE — Progress Notes (Signed)
[place]   [phone] [phone]   Clinic Follow up Note   Patient Care Team: Alla Feeling, NP as PCP - General (Nurse Practitioner) Rockwell Germany, RN as Oncology Nurse Navigator Mauro Kaufmann, RN as Oncology Nurse Navigator Donnie Mesa, MD as Consulting Physician (General Surgery) Truitt Merle, MD as Consulting Physician (Hematology) Kyung Rudd, MD as Consulting Physician (Radiation Oncology) Alla Feeling, NP as Nurse Practitioner (Nurse Practitioner) 12/14/2021  CHIEF COMPLAINT: Follow up right breast cancer   SUMMARY OF ONCOLOGIC HISTORY: Oncology History Overview Note  Cancer Staging Malignant neoplasm of upper-outer quadrant of right breast in female, estrogen receptor positive ([place]) Staging form: Breast, AJCC 8th Edition - Clinical stage from 12/26/2019: Stage IB (cT2, cN0, cM0, G2, ER+, PR+, HER2-) - Unsigned - Pathologic stage from 02/22/2020: Stage IA (pT2, pN0, cM0, G2, ER+, PR+, HER2-) - Signed by Alla Feeling, NP on 06/09/2020    Malignant neoplasm of upper-outer quadrant of right breast in female, estrogen receptor positive ([place])  12/20/2019 Initial Biopsy   Diagnosis 1. Breast, right, needle core biopsy, 7:30 o'clock, 4 cmfn, ribbon clip - FIBROCYSTIC CHANGES. - THERE IS NO EVIDENCE OF MALIGNANCY. - SEE COMMENT. 2. Breast, right, needle core biopsy, 9 o'clock, 5 cmfn, coil clip - INVASIVE MAMMARY CARCINOMA. - SEE COMMENT. 3. Breast, right, needle core biopsy, outer, x clip - INVASIVE MAMMARY CARCINOMA. - ATYPICAL LOBULAR HYPERPLASIA WITH CALCIFICATIONS. - SEE COMMENT. Microscopic Comment 2. The carcinoma appears at least grade 2. The longest span of tumor is 1.2 cm. An E-Cadherin and a breast prognostic profile will be performed on part 2 and the results reported separately. Dr. Jeannie Done has reviewed part 2 and concurs with this interpretation. The results were called to [facility] on  12/21/2019. 3. The carcinoma in part 3 is morphologically dissimilar from that in part 2. The longest span of tumor is 0.1 cm. An E-Cadherin and a breast prognostic profile will also be performed on part 3 and the results reported separately. (JBK:ah 12/21/19)   12/20/2019 Receptors her2   2. PROGNOSTIC INDICATORS Results: IMMUNOHISTOCHEMICAL AND MORPHOMETRIC ANALYSIS PERFORMED MANUALLY The tumor cells are NEGATIVE for Her2 (1+). Estrogen Receptor: 95%, POSITIVE, STRONG STAINING INTENSITY Progesterone Receptor: 95%, POSITIVE, STRONG STAINING INTENSITY Proliferation Marker Ki67: 15%   3. Prognostic indicators:  HER2 NEGATIVE  ER: 95% POSITIVE STRONG STAINING PR 90% POSITIVE STRONG STAINING  Proliferation Marker Ki67: 20%   12/20/2019 Mammogram   Diagnostic Mammogram  IMPRESSION    12/25/2019 Initial Diagnosis   Malignant neoplasm of upper-outer quadrant of right breast in female, estrogen receptor positive ([place])   12/29/2019 Breast MRI   IMPRESSION: 1. 2.5 cm spiculated mass in the upper outer right breast at middle depth consistent with the patient's biopsy-proven malignancy (coil clip). 2. Additional 9 mm enhancing mass located approximately 3 cm inferior to the index lesion. This is seen adjacent to a biopsy tract in the central right breast and may represents the additional biopsied focus of malignancy. The associated X-shaped clip was displaced 4 cm medially at the time of biopsy. 3. No MRI evidence of malignancy on the left. 4. No suspicious lymphadenopathy.   01/02/2020 Genetic Testing   Negative genetic testing:  No pathogenic variants detected on the [facility] Breast Cancer STAT panel or the Common Hereditary Cancers panel. The report dates are 01/02/2020 and 01/08/2020, respectively.  The Breast Cancer STAT Panel offered by [facility] includes sequencing and deletion/duplication analysis for the following 9  genes:  ATM, BRCA1, BRCA2, CDH1, CHEK2, PALB2, PTEN, STK11 and TP53. The  Common Hereditary Cancers Panel offered by [facility] includes sequencing and/or deletion duplication testing of the following 47 genes: APC, ATM, AXIN2, BARD1, BMPR1A, BRCA1, BRCA2, BRIP1, CDH1, CDK4, CDKN2A (p14ARF), CDKN2A (p16INK4a), CHEK2, CTNNA1, DICER1, EPCAM (Deletion/duplication testing only), GREM1 (promoter region deletion/duplication testing only), KIT, MEN1, MLH1, MSH2, MSH3, MSH6, MUTYH, NBN, NF1, NTHL1, PALB2, PDGFRA, PMS2, POLD1, POLE, PTEN, RAD50, RAD51C, RAD51D, SDHB, SDHC, SDHD, SMAD4, SMARCA4. STK11, TP53, TSC1, TSC2, and VHL.  The following genes were evaluated for sequence changes only: SDHA and HOXB13 c.251G>A variant only.   01/16/2020 Pathology Results   Diagnosis Breast, right, needle core biopsy, mid to post 1/3 out mid below known cancer - INVASIVE AND IN SITU LOBULAR CARCINOMA. - SEE MICROSCOPIC DESCRIPTION. Microscopic Comment The greatest dimension in a single core is 0.8 cm.   01/22/2020 -  Neo-Adjuvant Anti-estrogen oral therapy   Exemestane 9m once daily starting in 01/22/20   02/22/2020 Surgery   RIGHT MASTECTOMY WITH SENTINEL LYMPH NODE BIOPSY   02/22/2020 Pathology Results   FINAL MICROSCOPIC DIAGNOSIS:   A. BREAST, RIGHT, MASTECTOMY:  - Invasive lobular carcinoma, grade 2, spanning 4.1 cm.  - Lobular carcinoma in situ.  - Anterior margin is focally positive for invasive carcinoma.  - Perineural invasion.  - Lymphovascular invasion.  - See oncology table.   B. LYMPH NODE, RIGHT AXILLARY #1, SENTINEL, BIOPSY:  - One of one lymph nodes negative for carcinoma (0/1).   C. SKIN, RIGHT MASTECTOMY ADDITIONAL INFERIOR FLAP, EXCISION:  - Focal invasive lobular carcinoma, 2 mm, associated with lymphovascular  invasion.  - Margin is negative for carcinoma.    02/22/2020 Oncotype testing   RS 23  Distant risk of recurrence 9% Less than 1% benefit of chemotherapy   02/22/2020 Cancer Staging   Staging form: Breast, AJCC 8th Edition - Pathologic stage from  02/22/2020: Stage IA (pT2, pN0, cM0, G2, ER+, PR+, HER2-) - Signed by BAlla Feeling NP on 06/09/2020    08/08/2020 Survivorship   SCP mailed to patient 08/08/2020     CURRENT THERAPY: Exemestane starting 01/22/20, changed to anastrozole 05/2021 due to insurance coverage  INTERVAL HISTORY: Ms. NSeckelreturns for follow up as scheduled. Last seen by me 05/13/21. Screening breast MRI 05/26/21 was negative. She switched to anastrozole in 10/22 per insurance preference. She actually tolerates that better than exemestane, no joint pain. She does have some muscle aches from fibromyalgia and stable arthritis in her feet but these are not worse on anastrozole. She has lost 40 pounds intentionally since 04/2021 through [employer] and cutting out wine.  Overall she feels much better and remains to be a very positive person.  Her neurological symptoms resolved in the interim.  She is walking more.  She is scheduled for her for screening colonoscopy in 12/2021 and is wanting a referral to dermatology for routine skin check.  Overall she denies changes or concerns in her breast such as new lump/mass, nipple discharge or inversion, or skin change.  Denies fever, chills, cough, chest pain, dyspnea, new pain, or any other new complaints.   MEDICAL HISTORY:  Past Medical History:  Diagnosis Date   Anxiety    [place]   right breast invasive lobular cancer   Depression    Family history of colon cancer    Family history of melanoma    Family history of throat cancer    Family history of uterine cancer  Hypertension     SURGICAL HISTORY: Past Surgical History:  Procedure Laterality Date   c sections      x3   CESAREAN SECTION     KNEE ARTHROSCOPY Left    MASTECTOMY W/ SENTINEL NODE BIOPSY Right 02/22/2020   Procedure: RIGHT MASTECTOMY WITH SENTINEL LYMPH NODE BIOPSY;  Surgeon: Donnie Mesa, MD;  Location: [facility];  Service: General;  Laterality: Right;  PEC BLOCK    TONSILLECTOMY      I have reviewed the social history and family history with the patient and they are unchanged from previous note.  ALLERGIES:  is allergic to tylenol with codeine #3 [acetaminophen-codeine] and sulfa antibiotics.  MEDICATIONS:  Current Outpatient Medications  Medication Sig Dispense Refill   anastrozole (ARIMIDEX) 1 MG tablet TAKE 1 TABLET BY MOUTH EVERY DAY 90 tablet 3   Cholecalciferol (VITAMIN D3) 125 MCG (5000 UT) CAPS Take by mouth daily at 6 (six) AM.     Coenzyme Q10 (COQ10) 100 MG CAPS Take by mouth daily at 6 (six) AM.     diclofenac (VOLTAREN) 75 MG EC tablet Take 75 mg by mouth as needed.     ibuprofen (ADVIL) 800 MG tablet Take by mouth as needed.     cyclobenzaprine (FLEXERIL) 10 MG tablet Take 10 mg by mouth at bedtime.     lisinopril (ZESTRIL) 10 MG tablet Take 10 mg by mouth daily.     rosuvastatin (CRESTOR) 10 MG tablet Take 10 mg by mouth at bedtime.     venlafaxine XR (EFFEXOR-XR) 75 MG 24 hr capsule TAKE ONE CAPSULE BY MOUTH DAILY WITH BREAKFAST 30 capsule 3   No current facility-administered medications for this visit.    PHYSICAL EXAMINATION: ECOG PERFORMANCE STATUS: 0 - Asymptomatic  Vitals:   12/14/21 0929  BP: 120/72  Pulse: 85  Resp: 20  Temp: 97.7 F (36.5 C)  SpO2: 98%   Filed Weights   12/14/21 0929  Weight: 228 lb 6.4 oz (103.6 kg)    GENERAL:alert, no distress and comfortable SKIN: No rash EYES: sclera clear NECK: Without mass LYMPH:  no palpable cervical or supraclavicular lymphadenopathy LUNGS: clear with normal breathing effort HEART: regular rate & rhythm, no lower extremity edema ABDOMEN:abdomen soft, non-tender and normal bowel sounds NEURO: alert & oriented x 3 with fluent speech, no focal motor/sensory deficits Breast exam: S/p right mastectomy, incision completely healed with scar tissue.  No left nipple discharge or inversion.  No palpable mass or nodularity along the mastectomy site, chest wall, left breast,  or either axilla that I could appreciate   LABORATORY DATA:  I have reviewed the data as listed    Latest Ref Rng & Units 12/14/2021    9:11 AM 05/13/2021    9:31 AM 10/08/2020    9:05 AM  CBC  WBC 4.0 - 10.5 K/uL 7.5   8.2   7.2    Hemoglobin 12.0 - 15.0 g/dL 14.3   14.6   15.0    Hematocrit 36.0 - 46.0 % 42.4   43.7   45.7    Platelets 150 - 400 K/uL 254   237   237          Latest Ref Rng & Units 12/14/2021    9:11 AM 05/13/2021    9:31 AM 10/08/2020    9:05 AM  CMP  Glucose 70 - 99 mg/dL 94   108   105    BUN 6 - 20 mg/dL 7   15  13    Creatinine 0.44 - 1.00 mg/dL 0.72   0.72   0.79    Sodium 135 - 145 mmol/L 142   141   138    Potassium 3.5 - 5.1 mmol/L 4.4   4.5   4.5    Chloride 98 - 111 mmol/L 108   107   105    CO2 22 - 32 mmol/L '30   24   26    ' Calcium 8.9 - 10.3 mg/dL 10.2   10.0   9.8    Total Protein 6.5 - 8.1 g/dL 7.2   7.1   7.1    Total Bilirubin 0.3 - 1.2 mg/dL 0.5   0.5   0.5    Alkaline Phos 38 - 126 U/L 78   91   70    AST 15 - 41 U/L '14   22   17    ' ALT 0 - 44 U/L 19   36   27        RADIOGRAPHIC STUDIES: I have personally reviewed the radiological images as listed and agreed with the findings in the report. No results found.   ASSESSMENT & PLAN: Rigby Swamy is a 61 y.o. female with        1. 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 Given her multifocal disease, she underwent right mastectomy with Dr. Georgette Dover on 02/22/20. Her Oncotype RS was 23. Due to the low risk disease, adjuvant chemotherapy was not recommended.  She did not require post mastectomy radiation. -She started Exemestane before her breast surgery on 01/22/20. She has tolerated well with hot flashes managed well with Effexor and joint pain. Plan to continue for 7-10 years given her lobular carcinoma histology. Her insurance preferred  her to switch to Anastrozole in 04/2021, tolerating better without joint pain. -Screening left mammogram 12/12/2020 was negative, due this month -Screening breast MRI 05/26/2021 was negative, repeat q1-2 years -Per patient DEXA was normal, we will request the report from [facility] -next routine lab and office visit in 6 months, or sooner if needed.    2. H/o Cervical Cancer, On situ carcinoma, Genetic Testing (negative) -Dx 1990 and treated at [facility] in [place], [place]. Pap smears have been benign since.  -She has family history of Endometrial cancer, ADH, and colon cancer. -She has negative genetic testing: No pathogenic variants detected on the [facility] Breast Cancer STAT panel. The report date is 01/02/2020.   3. Anxiety/Depression, Hot flashes -She had been anxious and depressed about her cancer diagnosis and recent weight gain.  -She started Effexor for mood and hot flashes from AI, this was very effective however she weaned off and started Cymbalta for fibromyalgia -Cymbalta was not effective and she is back on Effexor, tolerating  -Mood is overall improved with intentional weight loss and stopping alcohol  4. Arthritis, Weight gain, Fibromyalgia, neuro symptoms (weakness, twitching, tingling, fall, balance issue) -She has chronic left hip and back pain since 2019. This resolved after shots to area and weight loss.  -She gained weight through [place] and with breast cancer diagnosis and antiestrogen therapy.  Her generalized body pain makes it difficult to exercise -She is losing weight intentionally through [employer] and alcohol cessation -On 04/2021 at last visit had various neuro symptoms including muscle weakness and twitching, loss of balance, tongue tingling, and fall.  She was seen by Dr. Krista Blue,  brain MRI 07/08/2021 was negative -Symptoms resolved through weight loss, alcohol cessation, depression and stress management   5. Smoking Cessation and health maintenance: - She was able to  quit smoking in 02/2020.  -Last Pap 2 years ago -Colonoscopy scheduled 01/18/2022 with Dr. Gala Romney -We will refer to Derm per request   6. Bone Health, Vit D Deficiency  -She notes she is currently on prescription Vit D. When she completes, she can reduce to Vit D 1000-5000 units daily.  -Per pt her last DEXA was done locally 2 years ago.  -Vitamin D level 20.3 on 06/23/2021, currently she takes 5000 IU daily (35K IU per week).  I recommend to take 10,000 IU Monday Wednesday Friday and 5000 the rest of the week    Disposition: Ms. Bassette is clinically doing well.  Tolerating anastrozole with no significant side effects.  Breast exam is benign.  CBC and CMP are unremarkable.  Overall there is no clinical concern for breast cancer recurrence.  Continue AI for 5-10 years and the surveillance plan which includes left mammo in 11/2021 and screening breast MRI q1-2 years (next due 05/2023).  I applauded her positive lifestyle changes and encouraged her to continue healthy active lifestyle and age-appropriate cancer screenings.  I will refer her to dermatology as requested.  Next surveillance visit in 6 months, or sooner if needed.   Orders Placed This Encounter  Procedures   MM 3D SCREEN BREAST UNI LEFT    Standing Status:   Future    Standing Expiration Date:   12/15/2022    Scheduling Instructions:     [facility]    Order Specific Question:   Reason for Exam (SYMPTOM  OR DIAGNOSIS REQUIRED)    Answer:   h/o R breast cancer (lobular) s/p mastectomy    Order Specific Question:   Is the patient pregnant?    Answer:   No    Order Specific Question:   Preferred imaging location?    Answer:   External   Ambulatory referral to Dermatology    Referral Priority:   Routine    Referral Type:   Consultation    Referral Reason:   Specialty Services Required    Requested Specialty:   Dermatology    Number of Visits Requested:   1   All questions were answered. The patient knows to call the clinic with any  problems, questions or concerns. No barriers to learning was detected. I spent 20 minutes counseling the patient face to face. The total time spent in the appointment was 30 minutes and more than 50% was on counseling and review of test results     Alla Feeling, NP 12/14/21

## 2021-12-14 ENCOUNTER — Inpatient Hospital Stay: Payer: BC Managed Care – PPO | Attending: Nurse Practitioner

## 2021-12-14 ENCOUNTER — Encounter: Payer: Self-pay | Admitting: Nurse Practitioner

## 2021-12-14 ENCOUNTER — Other Ambulatory Visit: Payer: Self-pay

## 2021-12-14 ENCOUNTER — Inpatient Hospital Stay (HOSPITAL_BASED_OUTPATIENT_CLINIC_OR_DEPARTMENT_OTHER): Payer: BC Managed Care – PPO | Admitting: Nurse Practitioner

## 2021-12-14 VITALS — BP 120/72 | HR 85 | Temp 97.7°F | Resp 20 | Wt 228.4 lb

## 2021-12-14 DIAGNOSIS — F418 Other specified anxiety disorders: Secondary | ICD-10-CM | POA: Insufficient documentation

## 2021-12-14 DIAGNOSIS — Z800 Family history of malignant neoplasm of digestive organs: Secondary | ICD-10-CM | POA: Diagnosis not present

## 2021-12-14 DIAGNOSIS — E559 Vitamin D deficiency, unspecified: Secondary | ICD-10-CM | POA: Insufficient documentation

## 2021-12-14 DIAGNOSIS — Z853 Personal history of malignant neoplasm of breast: Secondary | ICD-10-CM | POA: Insufficient documentation

## 2021-12-14 DIAGNOSIS — Z1283 Encounter for screening for malignant neoplasm of skin: Secondary | ICD-10-CM | POA: Diagnosis not present

## 2021-12-14 DIAGNOSIS — Z170 Estrogen receptor positive status [ER+]: Secondary | ICD-10-CM | POA: Diagnosis not present

## 2021-12-14 DIAGNOSIS — Z1231 Encounter for screening mammogram for malignant neoplasm of breast: Secondary | ICD-10-CM | POA: Diagnosis not present

## 2021-12-14 DIAGNOSIS — Z87891 Personal history of nicotine dependence: Secondary | ICD-10-CM | POA: Insufficient documentation

## 2021-12-14 DIAGNOSIS — R635 Abnormal weight gain: Secondary | ICD-10-CM | POA: Insufficient documentation

## 2021-12-14 DIAGNOSIS — Z808 Family history of malignant neoplasm of other organs or systems: Secondary | ICD-10-CM | POA: Insufficient documentation

## 2021-12-14 DIAGNOSIS — M797 Fibromyalgia: Secondary | ICD-10-CM | POA: Insufficient documentation

## 2021-12-14 DIAGNOSIS — C50411 Malignant neoplasm of upper-outer quadrant of right female breast: Secondary | ICD-10-CM | POA: Diagnosis not present

## 2021-12-14 DIAGNOSIS — M1990 Unspecified osteoarthritis, unspecified site: Secondary | ICD-10-CM | POA: Insufficient documentation

## 2021-12-14 LAB — CBC WITH DIFFERENTIAL (CANCER CENTER ONLY)
Abs Immature Granulocytes: 0.03 10*3/uL (ref 0.00–0.07)
Basophils Absolute: 0 10*3/uL (ref 0.0–0.1)
Basophils Relative: 0 %
Eosinophils Absolute: 0.1 10*3/uL (ref 0.0–0.5)
Eosinophils Relative: 1 %
HCT: 42.4 % (ref 36.0–46.0)
Hemoglobin: 14.3 g/dL (ref 12.0–15.0)
Immature Granulocytes: 0 %
Lymphocytes Relative: 23 %
Lymphs Abs: 1.8 10*3/uL (ref 0.7–4.0)
MCH: 32.9 pg (ref 26.0–34.0)
MCHC: 33.7 g/dL (ref 30.0–36.0)
MCV: 97.5 fL (ref 80.0–100.0)
Monocytes Absolute: 0.5 10*3/uL (ref 0.1–1.0)
Monocytes Relative: 6 %
Neutro Abs: 5.1 10*3/uL (ref 1.7–7.7)
Neutrophils Relative %: 70 %
Platelet Count: 254 10*3/uL (ref 150–400)
RBC: 4.35 MIL/uL (ref 3.87–5.11)
RDW: 12.5 % (ref 11.5–15.5)
WBC Count: 7.5 10*3/uL (ref 4.0–10.5)
nRBC: 0 % (ref 0.0–0.2)

## 2021-12-14 LAB — CMP (CANCER CENTER ONLY)
ALT: 19 U/L (ref 0–44)
AST: 14 U/L — ABNORMAL LOW (ref 15–41)
Albumin: 4.5 g/dL (ref 3.5–5.0)
Alkaline Phosphatase: 78 U/L (ref 38–126)
Anion gap: 4 — ABNORMAL LOW (ref 5–15)
BUN: 7 mg/dL (ref 6–20)
CO2: 30 mmol/L (ref 22–32)
Calcium: 10.2 mg/dL (ref 8.9–10.3)
Chloride: 108 mmol/L (ref 98–111)
Creatinine: 0.72 mg/dL (ref 0.44–1.00)
GFR, Estimated: >60 mL/min (ref >60)
Glucose, Bld: 94 mg/dL (ref 70–99)
Potassium: 4.4 mmol/L (ref 3.5–5.1)
Sodium: 142 mmol/L (ref 135–145)
Total Bilirubin: 0.5 mg/dL (ref 0.3–1.2)
Total Protein: 7.2 g/dL (ref 6.5–8.1)

## 2022-01-18 ENCOUNTER — Ambulatory Visit (HOSPITAL_COMMUNITY): Payer: BC Managed Care – PPO | Admitting: Anesthesiology

## 2022-01-18 ENCOUNTER — Other Ambulatory Visit: Payer: Self-pay

## 2022-01-18 ENCOUNTER — Encounter (HOSPITAL_COMMUNITY): Payer: Self-pay | Admitting: Internal Medicine

## 2022-01-18 ENCOUNTER — Ambulatory Visit (HOSPITAL_COMMUNITY)
Admission: RE | Admit: 2022-01-18 | Discharge: 2022-01-18 | Disposition: A | Discharge status: Home / Self Care | Payer: BC Managed Care – PPO | Attending: Internal Medicine | Admitting: Internal Medicine

## 2022-01-18 ENCOUNTER — Encounter (HOSPITAL_COMMUNITY)
Admission: RE | Disposition: A | Discharge status: Home / Self Care | Payer: Self-pay | Source: Home / Self Care | Attending: Internal Medicine

## 2022-01-18 DIAGNOSIS — Z8371 Family history of colonic polyps: Secondary | ICD-10-CM | POA: Diagnosis not present

## 2022-01-18 DIAGNOSIS — F32A Depression, unspecified: Secondary | ICD-10-CM | POA: Diagnosis not present

## 2022-01-18 DIAGNOSIS — Z87891 Personal history of nicotine dependence: Secondary | ICD-10-CM | POA: Insufficient documentation

## 2022-01-18 DIAGNOSIS — I10 Essential (primary) hypertension: Secondary | ICD-10-CM | POA: Insufficient documentation

## 2022-01-18 DIAGNOSIS — K635 Polyp of colon: Secondary | ICD-10-CM | POA: Diagnosis not present

## 2022-01-18 DIAGNOSIS — F419 Anxiety disorder, unspecified: Secondary | ICD-10-CM | POA: Diagnosis not present

## 2022-01-18 DIAGNOSIS — D124 Benign neoplasm of descending colon: Secondary | ICD-10-CM | POA: Diagnosis not present

## 2022-01-18 DIAGNOSIS — Z1211 Encounter for screening for malignant neoplasm of colon: Secondary | ICD-10-CM

## 2022-01-18 HISTORY — PX: COLONOSCOPY WITH PROPOFOL: SHX5780

## 2022-01-18 HISTORY — PX: POLYPECTOMY: SHX5525

## 2022-01-18 SURGERY — COLONOSCOPY WITH PROPOFOL
Anesthesia: General

## 2022-01-18 MED ORDER — LIDOCAINE HCL (CARDIAC) PF 100 MG/5ML IV SOSY
PREFILLED_SYRINGE | INTRAVENOUS | Status: DC | PRN
  Administered 2022-01-18: 50 mg via INTRAVENOUS

## 2022-01-18 MED ORDER — PROPOFOL 500 MG/50ML IV EMUL
INTRAVENOUS | Status: DC | PRN
  Administered 2022-01-18: 150 ug/kg/min via INTRAVENOUS

## 2022-01-18 MED ORDER — LACTATED RINGERS IV SOLN: INTRAVENOUS | Status: DC

## 2022-01-18 MED ORDER — PROPOFOL 10 MG/ML IV BOLUS
INTRAVENOUS | Status: DC | PRN
  Administered 2022-01-18: 100 mg via INTRAVENOUS
  Administered 2022-01-18: 50 mg via INTRAVENOUS

## 2022-01-18 NOTE — Anesthesia Postprocedure Evaluation (Signed)
Anesthesia Post Note  Patient: Cynthia Shaw  Procedure(s) Performed: COLONOSCOPY WITH PROPOFOL POLYPECTOMY  Patient location during evaluation: Phase II Anesthesia Type: General Level of consciousness: awake Pain management: pain level controlled Vital Signs Assessment: post-procedure vital signs reviewed and stable Respiratory status: spontaneous breathing and respiratory function stable Cardiovascular status: blood pressure returned to baseline and stable Postop Assessment: no headache and no apparent nausea or vomiting Anesthetic complications: no Comments: Late entry   No notable events documented.   Last Vitals:  Vitals:   01/18/22 0722 01/18/22 0834  BP: 127/86 97/65  Pulse: 80   Resp: 18 16  Temp: 37.1 C (!) 36.4 C  SpO2: 97% 95%    Last Pain:  Vitals:   01/18/22 0834  TempSrc: Oral  PainSc: 0-No pain                 Windell Norfolk

## 2022-01-19 ENCOUNTER — Ambulatory Visit
Admission: RE | Admit: 2022-01-19 | Discharge: 2022-01-19 | Disposition: A | Discharge status: Home / Self Care | Payer: BC Managed Care – PPO | Source: Ambulatory Visit | Attending: Nurse Practitioner | Admitting: Nurse Practitioner

## 2022-01-19 ENCOUNTER — Encounter: Payer: Self-pay | Admitting: Internal Medicine

## 2022-01-19 DIAGNOSIS — Z1231 Encounter for screening mammogram for malignant neoplasm of breast: Secondary | ICD-10-CM

## 2022-01-19 LAB — SURGICAL PATHOLOGY

## 2022-01-22 ENCOUNTER — Encounter (HOSPITAL_COMMUNITY): Payer: Self-pay | Admitting: Internal Medicine

## 2022-04-06 ENCOUNTER — Other Ambulatory Visit: Payer: Self-pay | Admitting: Nurse Practitioner

## 2022-06-15 NOTE — Progress Notes (Deleted)
Cynthia Shaw   [phone] Fax:([phone]) [phone]   Clinic Follow up Note   Patient Care Team: Madelyn Brunner, MD as PCP - General (Neurology) Rockwell Germany, RN as Oncology Nurse Navigator Mauro Kaufmann, RN as Oncology Nurse Navigator Donnie Mesa, MD as Consulting Physician (General Surgery) Truitt Merle, MD as Consulting Physician (Hematology) Kyung Rudd, MD as Consulting Physician (Radiation Oncology) Alla Feeling, NP as Nurse Practitioner (Nurse Practitioner) 06/15/2022  CHIEF COMPLAINT: Follow-up right breast cancer  SUMMARY OF ONCOLOGIC HISTORY: Oncology History Overview Note  Cancer Staging Malignant neoplasm of upper-outer quadrant of right breast in female, estrogen receptor positive ([place]) Staging form: Breast, AJCC 8th Edition - Clinical stage from 12/26/2019: Stage IB (cT2, cN0, cM0, G2, ER+, PR+, HER2-) - Unsigned - Pathologic stage from 02/22/2020: Stage IA (pT2, pN0, cM0, G2, ER+, PR+, HER2-) - Signed by Alla Feeling, NP on 06/09/2020    Malignant neoplasm of upper-outer quadrant of right breast in female, estrogen receptor positive ([place])  12/20/2019 Initial Biopsy   Diagnosis 1. Breast, right, needle core biopsy, 7:30 o'clock, 4 cmfn, ribbon clip - FIBROCYSTIC CHANGES. - THERE IS NO EVIDENCE OF MALIGNANCY. - SEE COMMENT. 2. Breast, right, needle core biopsy, 9 o'clock, 5 cmfn, coil clip - INVASIVE MAMMARY CARCINOMA. - SEE COMMENT. 3. Breast, right, needle core biopsy, outer, x clip - INVASIVE MAMMARY CARCINOMA. - ATYPICAL LOBULAR HYPERPLASIA WITH CALCIFICATIONS. - SEE COMMENT. Microscopic Comment 2. The carcinoma appears at least grade 2. The longest span of tumor is 1.2 cm. An E-Cadherin and a breast prognostic profile will be performed on part 2 and the results reported separately. Dr. Jeannie Done has reviewed part 2 and concurs with this interpretation. The results were called to [facility] on 12/21/2019. 3.  The carcinoma in part 3 is morphologically dissimilar from that in part 2. The longest span of tumor is 0.1 cm. An E-Cadherin and a breast prognostic profile will also be performed on part 3 and the results reported separately. (JBK:ah 12/21/19)   12/20/2019 Receptors her2   2. PROGNOSTIC INDICATORS Results: IMMUNOHISTOCHEMICAL AND MORPHOMETRIC ANALYSIS PERFORMED MANUALLY The tumor cells are NEGATIVE for Her2 (1+). Estrogen Receptor: 95%, POSITIVE, STRONG STAINING INTENSITY Progesterone Receptor: 95%, POSITIVE, STRONG STAINING INTENSITY Proliferation Marker Ki67: 15%   3. Prognostic indicators:  HER2 NEGATIVE  ER: 95% POSITIVE STRONG STAINING PR 90% POSITIVE STRONG STAINING  Proliferation Marker Ki67: 20%   12/20/2019 Mammogram   Diagnostic Mammogram  IMPRESSION    12/25/2019 Initial Diagnosis   Malignant neoplasm of upper-outer quadrant of right breast in female, estrogen receptor positive ([place])   12/29/2019 Breast MRI   IMPRESSION: 1. 2.5 cm spiculated mass in the upper outer right breast at middle depth consistent with the patient's biopsy-proven malignancy (coil clip). 2. Additional 9 mm enhancing mass located approximately 3 cm inferior to the index lesion. This is seen adjacent to a biopsy tract in the central right breast and may represents the additional biopsied focus of malignancy. The associated X-shaped clip was displaced 4 cm medially at the time of biopsy. 3. No MRI evidence of malignancy on the left. 4. No suspicious lymphadenopathy.   01/02/2020 Genetic Testing   Negative genetic testing:  No pathogenic variants detected on the [facility] Breast Cancer STAT panel or the Common Hereditary Cancers panel. The report dates are 01/02/2020 and 01/08/2020, respectively.  The Breast Cancer STAT Panel offered by [facility] includes sequencing and deletion/duplication analysis for the following 9 genes:  ATM,  BRCA1, BRCA2, CDH1, CHEK2, PALB2, PTEN, STK11 and TP53. The Common  Hereditary Cancers Panel offered by [facility] includes sequencing and/or deletion duplication testing of the following 47 genes: APC, ATM, AXIN2, BARD1, BMPR1A, BRCA1, BRCA2, BRIP1, CDH1, CDK4, CDKN2A (p14ARF), CDKN2A (p16INK4a), CHEK2, CTNNA1, DICER1, EPCAM (Deletion/duplication testing only), GREM1 (promoter region deletion/duplication testing only), KIT, MEN1, MLH1, MSH2, MSH3, MSH6, MUTYH, NBN, NF1, NTHL1, PALB2, PDGFRA, PMS2, POLD1, POLE, PTEN, RAD50, RAD51C, RAD51D, SDHB, SDHC, SDHD, SMAD4, SMARCA4. STK11, TP53, TSC1, TSC2, and VHL.  The following genes were evaluated for sequence changes only: SDHA and HOXB13 c.251G>A variant only.   01/16/2020 Pathology Results   Diagnosis Breast, right, needle core biopsy, mid to post 1/3 out mid below known cancer - INVASIVE AND IN SITU LOBULAR CARCINOMA. - SEE MICROSCOPIC DESCRIPTION. Microscopic Comment The greatest dimension in a single core is 0.8 cm.   01/22/2020 -  Neo-Adjuvant Anti-estrogen oral therapy   Exemestane 62m once daily starting in 01/22/20   02/22/2020 Surgery   RIGHT MASTECTOMY WITH SENTINEL LYMPH NODE BIOPSY   02/22/2020 Pathology Results   FINAL MICROSCOPIC DIAGNOSIS:   A. BREAST, RIGHT, MASTECTOMY:  - Invasive lobular carcinoma, grade 2, spanning 4.1 cm.  - Lobular carcinoma in situ.  - Anterior margin is focally positive for invasive carcinoma.  - Perineural invasion.  - Lymphovascular invasion.  - See oncology table.   B. LYMPH NODE, RIGHT AXILLARY #1, SENTINEL, BIOPSY:  - One of one lymph nodes negative for carcinoma (0/1).   C. SKIN, RIGHT MASTECTOMY ADDITIONAL INFERIOR FLAP, EXCISION:  - Focal invasive lobular carcinoma, 2 mm, associated with lymphovascular  invasion.  - Margin is negative for carcinoma.    02/22/2020 Oncotype testing   RS 23  Distant risk of recurrence 9% Less than 1% benefit of chemotherapy   02/22/2020 Cancer Staging   Staging form: Breast, AJCC 8th Edition - Pathologic stage from 02/22/2020:  Stage IA (pT2, pN0, cM0, G2, ER+, PR+, HER2-) - Signed by BAlla Feeling NP on 06/09/2020   08/08/2020 Survivorship   SCP mailed to patient 08/08/2020     CURRENT THERAPY:  Exemestane starting 01/22/20, changed to anastrozole 05/2021 due to insurance coverage   INTERVAL HISTORY: Ms. NBlenda Pealsreturns for follow-up as scheduled, last seen by me 12/14/2021.  She underwent a screening colonoscopy 01/18/2022 and a left mammogram the next day which was negative.  She continues anastrozole.   REVIEW OF SYSTEMS:   Constitutional: Denies fevers, chills or abnormal weight loss Eyes: Denies blurriness of vision Ears, nose, mouth, throat, and face: Denies mucositis or sore throat Respiratory: Denies cough, dyspnea or wheezes Cardiovascular: Denies palpitation, chest discomfort or lower extremity swelling Gastrointestinal:  Denies nausea, heartburn or change in bowel habits Skin: Denies abnormal skin rashes Lymphatics: Denies new lymphadenopathy or easy bruising Neurological:Denies numbness, tingling or new weaknesses Behavioral/Psych: Mood is stable, no new changes  All other systems were reviewed with the patient and are negative.  MEDICAL HISTORY:  Past Medical History:  Diagnosis Date   Anxiety    Cancer [place] 12/2019   right breast invasive lobular cancer   Depression    Family history of colon cancer    Family history of melanoma    Family history of throat cancer    Family history of uterine cancer    Hypertension     SURGICAL HISTORY: Past Surgical History:  Procedure Laterality Date   c sections      x3   CESAREAN SECTION     COLONOSCOPY WITH PROPOFOL  N/A 01/18/2022   Procedure: COLONOSCOPY WITH PROPOFOL;  Surgeon: Daneil Dolin, MD;  [facility];  Service: Endoscopy;  Laterality: N/A;  8:15 / ASA 2   KNEE ARTHROSCOPY Left    MASTECTOMY W/ SENTINEL NODE BIOPSY Right 02/22/2020   Procedure: RIGHT MASTECTOMY WITH SENTINEL LYMPH NODE BIOPSY;  Surgeon: Donnie Mesa,  MD;  Location: [facility];  Service: General;  Laterality: Right;  PEC BLOCK   POLYPECTOMY  01/18/2022   Procedure: POLYPECTOMY;  Surgeon: Daneil Dolin, MD;  Location: [facility];  Service: Endoscopy;;   TONSILLECTOMY      I have reviewed the social history and family history with the patient and they are unchanged from previous note.  ALLERGIES:  is allergic to tylenol with codeine #3 [acetaminophen-codeine] and sulfa antibiotics.  MEDICATIONS:  Current Outpatient Medications  Medication Sig Dispense Refill   anastrozole (ARIMIDEX) 1 MG tablet TAKE 1 TABLET BY MOUTH EVERY DAY 90 tablet 3   Cholecalciferol (VITAMIN D3) 125 MCG (5000 UT) CAPS Take 5,000 Units by mouth daily at 6 (six) AM.     Coenzyme Q10 (CO Q-10) 100 MG CAPS Take 100 mg by mouth daily.     diclofenac (VOLTAREN) 75 MG EC tablet Take 75 mg by mouth daily as needed (arthritis pain in feet).     ibuprofen (ADVIL) 800 MG tablet Take 800 mg by mouth daily as needed (fibromyalgia).     lisinopril (ZESTRIL) 20 MG tablet Take 20 mg by mouth daily.     rosuvastatin (CRESTOR) 10 MG tablet Take 10 mg by mouth at bedtime.     venlafaxine XR (EFFEXOR-XR) 75 MG 24 hr capsule TAKE ONE CAPSULE BY MOUTH DAILY WITH BREAKFAST 30 capsule 3   No current facility-administered medications for this visit.    PHYSICAL EXAMINATION: ECOG PERFORMANCE STATUS: {CHL ONC ECOG PS:[id number]}  There were no vitals filed for this visit. There were no vitals filed for this visit.  GENERAL:alert, no distress and comfortable SKIN: skin color, texture, turgor are normal, no rashes or significant lesions EYES: normal, Conjunctiva are pink and non-injected, sclera clear OROPHARYNX:no exudate, no erythema and lips, buccal mucosa, and tongue normal  NECK: supple, thyroid normal size, non-tender, without nodularity LYMPH:  no palpable lymphadenopathy in the cervical, axillary or inguinal LUNGS: clear to auscultation and percussion with  normal breathing effort HEART: regular rate & rhythm and no murmurs and no lower extremity edema ABDOMEN:abdomen soft, non-tender and normal bowel sounds Musculoskeletal:no cyanosis of digits and no clubbing  NEURO: alert & oriented x 3 with fluent speech, no focal motor/sensory deficits  LABORATORY DATA:  I have reviewed the data as listed    Latest Ref Rng & Units 12/14/2021    9:11 AM 05/13/2021    9:31 AM 10/08/2020    9:05 AM  CBC  WBC 4.0 - 10.5 K/uL 7.5  8.2  7.2   Hemoglobin 12.0 - 15.0 g/dL 14.3  14.6  15.0   Hematocrit 36.0 - 46.0 % 42.4  43.7  45.7   Platelets 150 - 400 K/uL 254  237  237         Latest Ref Rng & Units 12/14/2021    9:11 AM 05/13/2021    9:31 AM 10/08/2020    9:05 AM  CMP  Glucose 70 - 99 mg/dL 94  108  105   BUN 6 - 20 mg/dL _0 Creatinine 0.44 - 1.00 mg/dL 0.72  0.72  0.79   Sodium 135 - 145 mmol/L 142  141  138   Potassium 3.5 - 5.1 mmol/L 4.4  4.5  4.5   Chloride 98 - 111 mmol/L 108  107  105   CO2 22 - 32 mmol/L _0 Calcium 8.9 - 10.3 mg/dL 10.2  10.0  9.8   Total Protein 6.5 - 8.1 g/dL 7.2  7.1  7.1   Total Bilirubin 0.3 - 1.2 mg/dL 0.5  0.5  0.5   Alkaline Phos 38 - 126 U/L 78  91  70   AST 15 - 41 U/L _1 ALT 0 - 44 U/L 19  36  27       RADIOGRAPHIC STUDIES: I have personally reviewed the radiological images as listed and agreed with the findings in the report. No results found.   ASSESSMENT & PLAN:  No problem-specific Assessment & Plan notes found for this encounter.   No orders of the defined types were placed in this encounter.  All questions were answered. The patient knows to call the clinic with any problems, questions or concerns. No barriers to learning was detected. I spent {CHL ONC TIME VISIT - CJARW:[id number]} counseling the patient face to face. The total time spent in the appointment was {CHL ONC TIME VISIT - EIHDT:[id number]} and more than 50% was on counseling and review of test  results     Alla Feeling, NP 06/15/22

## 2022-06-16 ENCOUNTER — Inpatient Hospital Stay: Payer: BC Managed Care – PPO

## 2022-06-16 ENCOUNTER — Inpatient Hospital Stay: Payer: BC Managed Care – PPO | Attending: Nurse Practitioner | Admitting: Nurse Practitioner

## 2022-07-14 ENCOUNTER — Encounter: Payer: Self-pay | Admitting: Nurse Practitioner

## 2022-08-16 ENCOUNTER — Inpatient Hospital Stay: Payer: BC Managed Care – PPO | Admitting: Nurse Practitioner

## 2022-08-16 ENCOUNTER — Other Ambulatory Visit: Payer: Self-pay | Admitting: Nurse Practitioner

## 2022-08-16 ENCOUNTER — Inpatient Hospital Stay: Payer: BC Managed Care – PPO

## 2022-09-22 ENCOUNTER — Inpatient Hospital Stay: Payer: BC Managed Care – PPO

## 2022-09-22 ENCOUNTER — Inpatient Hospital Stay: Payer: BC Managed Care – PPO | Admitting: Nurse Practitioner

## 2022-10-05 NOTE — Progress Notes (Unsigned)
Patient Care Team: Madelyn Brunner, MD as PCP - General (Neurology) Rockwell Germany, RN as Oncology Nurse Navigator Mauro Kaufmann, RN as Oncology Nurse Navigator Donnie Mesa, MD as Consulting Physician (General Surgery) Truitt Merle, MD as Consulting Physician (Hematology) Kyung Rudd, MD as Consulting Physician (Radiation Oncology) Alla Feeling, NP as Nurse Practitioner (Nurse Practitioner)   CHIEF COMPLAINT: Follow-up right breast cancer  Oncology History Overview Note  Cancer Staging Malignant neoplasm of upper-outer quadrant of right breast in female, estrogen receptor positive [facility]) Staging form: Breast, AJCC 8th Edition - Clinical stage from 12/26/2019: Stage IB (cT2, cN0, cM0, G2, ER+, PR+, HER2-) - Unsigned - Pathologic stage from 02/22/2020: Stage IA (pT2, pN0, cM0, G2, ER+, PR+, HER2-) - Signed by Alla Feeling, NP on 06/09/2020    Malignant neoplasm of upper-outer quadrant of right breast in female, estrogen receptor positive ([place])  12/20/2019 Initial Biopsy   Diagnosis 1. Breast, right, needle core biopsy, 7:30 o'clock, 4 cmfn, ribbon clip - FIBROCYSTIC CHANGES. - THERE IS NO EVIDENCE OF MALIGNANCY. - SEE COMMENT. 2. Breast, right, needle core biopsy, 9 o'clock, 5 cmfn, coil clip - INVASIVE MAMMARY CARCINOMA. - SEE COMMENT. 3. Breast, right, needle core biopsy, outer, x clip - INVASIVE MAMMARY CARCINOMA. - ATYPICAL LOBULAR HYPERPLASIA WITH CALCIFICATIONS. - SEE COMMENT. Microscopic Comment 2. The carcinoma appears at least grade 2. The longest span of tumor is 1.2 cm. An E-Cadherin and a breast prognostic profile will be performed on part 2 and the results reported separately. Dr. Jeannie Done has reviewed part 2 and concurs with this interpretation. The results were called to [facility] on 12/21/2019. 3. The carcinoma in part 3 is morphologically dissimilar from that in part 2. The longest span of tumor is 0.1 cm. An E-Cadherin and a breast  prognostic profile will also be performed on part 3 and the results reported separately. (JBK:ah 12/21/19)   12/20/2019 Receptors her2   2. PROGNOSTIC INDICATORS Results: IMMUNOHISTOCHEMICAL AND MORPHOMETRIC ANALYSIS PERFORMED MANUALLY The tumor cells are NEGATIVE for Her2 (1+). Estrogen Receptor: 95%, POSITIVE, STRONG STAINING INTENSITY Progesterone Receptor: 95%, POSITIVE, STRONG STAINING INTENSITY Proliferation Marker Ki67: 15%   3. Prognostic indicators:  HER2 NEGATIVE  ER: 95% POSITIVE STRONG STAINING PR 90% POSITIVE STRONG STAINING  Proliferation Marker Ki67: 20%   12/20/2019 Mammogram   Diagnostic Mammogram  IMPRESSION    12/25/2019 Initial Diagnosis   Malignant neoplasm of upper-outer quadrant of right breast in female, estrogen receptor positive ([place])   12/29/2019 Breast MRI   IMPRESSION: 1. 2.5 cm spiculated mass in the upper outer right breast at middle depth consistent with the patient's biopsy-proven malignancy (coil clip). 2. Additional 9 mm enhancing mass located approximately 3 cm inferior to the index lesion. This is seen adjacent to a biopsy tract in the central right breast and may represents the additional biopsied focus of malignancy. The associated X-shaped clip was displaced 4 cm medially at the time of biopsy. 3. No MRI evidence of malignancy on the left. 4. No suspicious lymphadenopathy.   01/02/2020 Genetic Testing   Negative genetic testing:  No pathogenic variants detected on the [facility] Breast Cancer STAT panel or the Common Hereditary Cancers panel. The report dates are 01/02/2020 and 01/08/2020, respectively.  The Breast Cancer STAT Panel offered by [facility] includes sequencing and deletion/duplication analysis for the following 9 genes:  ATM, BRCA1, BRCA2, CDH1, CHEK2, PALB2, PTEN, STK11 and TP53. The Common Hereditary Cancers Panel offered by [facility] includes sequencing and/or  deletion duplication testing of the following 47 genes: APC, ATM, AXIN2,  BARD1, BMPR1A, BRCA1, BRCA2, BRIP1, CDH1, CDK4, CDKN2A (p14ARF), CDKN2A (p16INK4a), CHEK2, CTNNA1, DICER1, EPCAM (Deletion/duplication testing only), GREM1 (promoter region deletion/duplication testing only), KIT, MEN1, MLH1, MSH2, MSH3, MSH6, MUTYH, NBN, NF1, NTHL1, PALB2, PDGFRA, PMS2, POLD1, POLE, PTEN, RAD50, RAD51C, RAD51D, SDHB, SDHC, SDHD, SMAD4, SMARCA4. STK11, TP53, TSC1, TSC2, and VHL.  The following genes were evaluated for sequence changes only: SDHA and HOXB13 c.251G>A variant only.   01/16/2020 Pathology Results   Diagnosis Breast, right, needle core biopsy, mid to post 1/3 out mid below known cancer - INVASIVE AND IN SITU LOBULAR CARCINOMA. - SEE MICROSCOPIC DESCRIPTION. Microscopic Comment The greatest dimension in a single core is 0.8 cm.   01/22/2020 -  Neo-Adjuvant Anti-estrogen oral therapy   Exemestane '25mg'$  once daily starting in 01/22/20   02/22/2020 Surgery   RIGHT MASTECTOMY WITH SENTINEL LYMPH NODE BIOPSY   02/22/2020 Pathology Results   FINAL MICROSCOPIC DIAGNOSIS:   A. BREAST, RIGHT, MASTECTOMY:  - Invasive lobular carcinoma, grade 2, spanning 4.1 cm.  - Lobular carcinoma in situ.  - Anterior margin is focally positive for invasive carcinoma.  - Perineural invasion.  - Lymphovascular invasion.  - See oncology table.   B. LYMPH NODE, RIGHT AXILLARY #1, SENTINEL, BIOPSY:  - One of one lymph nodes negative for carcinoma (0/1).   C. SKIN, RIGHT MASTECTOMY ADDITIONAL INFERIOR FLAP, EXCISION:  - Focal invasive lobular carcinoma, 2 mm, associated with lymphovascular  invasion.  - Margin is negative for carcinoma.    02/22/2020 Oncotype testing   RS 23  Distant risk of recurrence 9% Less than 1% benefit of chemotherapy   02/22/2020 Cancer Staging   Staging form: Breast, AJCC 8th Edition - Pathologic stage from 02/22/2020: Stage IA (pT2, pN0, cM0, G2, ER+, PR+, HER2-) - Signed by Alla Feeling, NP on 06/09/2020   08/08/2020 Survivorship   SCP mailed to patient  08/08/2020      CURRENT THERAPY: Exemestane starting 01/22/20, changed to anastrozole 05/2021 due to insurance coverage   INTERVAL HISTORY Ms Leatherwood returns for follow-up as scheduled, last seen by me 12/14/2021.  Colonoscopy 01/18/2022 showed 3 polyps which were benign but precancerous, on 5-year recall.  Mammogram 01/19/2022 was negative.  She continues anastrozole.  ROS   Past Medical History:  Diagnosis Date   Anxiety    Cancer ([place]) 12/2019   right breast invasive lobular cancer   Depression    Family history of colon cancer    Family history of melanoma    Family history of throat cancer    Family history of uterine cancer    Hypertension      Past Surgical History:  Procedure Laterality Date   c sections      x3   CESAREAN SECTION     COLONOSCOPY WITH PROPOFOL N/A 01/18/2022   Procedure: COLONOSCOPY WITH PROPOFOL;  Surgeon: Daneil Dolin, MD;  [facility];  Service: Endoscopy;  Laterality: N/A;  8:15 / ASA 2   KNEE ARTHROSCOPY Left    MASTECTOMY W/ SENTINEL NODE BIOPSY Right 02/22/2020   Procedure: RIGHT MASTECTOMY WITH SENTINEL LYMPH NODE BIOPSY;  Surgeon: Donnie Mesa, MD;  Location: [place];  Service: General;  Laterality: Right;  PEC BLOCK   POLYPECTOMY  01/18/2022   Procedure: POLYPECTOMY;  Surgeon: Daneil Dolin, MD;  [facility]: [facility];  Service: Endoscopy;;   TONSILLECTOMY       Outpatient Encounter Medications as of  10/06/2022  Medication Sig   anastrozole (ARIMIDEX) 1 MG tablet TAKE 1 TABLET BY MOUTH EVERY DAY   Cholecalciferol (VITAMIN D3) 125 MCG (5000 UT) CAPS Take 5,000 Units by mouth daily at 6 (six) AM.   Coenzyme Q10 (CO Q-10) 100 MG CAPS Take 100 mg by mouth daily.   diclofenac (VOLTAREN) 75 MG EC tablet Take 75 mg by mouth daily as needed (arthritis pain in feet).   ibuprofen (ADVIL) 800 MG tablet Take 800 mg by mouth daily as needed (fibromyalgia).   lisinopril (ZESTRIL) 20 MG tablet Take 20 mg by mouth daily.    rosuvastatin (CRESTOR) 10 MG tablet Take 10 mg by mouth at bedtime.   venlafaxine XR (EFFEXOR-XR) 75 MG 24 hr capsule TAKE ONE CAPSULE BY MOUTH DAILY WITH BREAKFAST   No facility-administered encounter medications on file as of 10/06/2022.     There were no vitals filed for this visit. There is no height or weight on file to calculate BMI.   PHYSICAL EXAM GENERAL:alert, no distress and comfortable SKIN: no rash  EYES: sclera clear NECK: without mass LYMPH:  no palpable cervical or supraclavicular lymphadenopathy  LUNGS: clear with normal breathing effort HEART: regular rate & rhythm, no lower extremity edema ABDOMEN: abdomen soft, non-tender and normal bowel sounds NEURO: alert & oriented x 3 with fluent speech, no focal motor/sensory deficits Breast exam:  PAC without erythema    CBC    Component Value Date/Time   WBC 7.5 12/14/2021 0911   RBC 4.35 12/14/2021 0911   HGB 14.3 12/14/2021 0911   HCT 42.4 12/14/2021 0911   PLT 254 12/14/2021 0911   MCV 97.5 12/14/2021 0911   MCH 32.9 12/14/2021 0911   MCHC 33.7 12/14/2021 0911   RDW 12.5 12/14/2021 0911   LYMPHSABS 1.8 12/14/2021 0911   MONOABS 0.5 12/14/2021 0911   EOSABS 0.1 12/14/2021 0911   BASOSABS 0.0 12/14/2021 0911     CMP     Component Value Date/Time   NA 142 12/14/2021 0911   K 4.4 12/14/2021 0911   CL 108 12/14/2021 0911   CO2 30 12/14/2021 0911   GLUCOSE 94 12/14/2021 0911   BUN 7 12/14/2021 0911   CREATININE 0.72 12/14/2021 0911   CALCIUM 10.2 12/14/2021 0911   PROT 7.2 12/14/2021 0911   ALBUMIN 4.5 12/14/2021 0911   AST 14 (L) 12/14/2021 0911   ALT 19 12/14/2021 0911   ALKPHOS 78 12/14/2021 0911   BILITOT 0.5 12/14/2021 0911   GFRNONAA >60 12/14/2021 0911   GFRAA >60 12/26/2019 1208     ASSESSMENT & PLAN:Roselina Goetting is a 62 y.o. female with        1. 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 Given her multifocal disease, she underwent right mastectomy with Dr. Georgette Dover on 02/22/20. Her Oncotype RS was 23. Due to the low risk disease, adjuvant chemotherapy was not recommended.  She did not require post mastectomy radiation. -She started Exemestane before her breast surgery on 01/22/20. She has tolerated well with hot flashes managed well with Effexor and joint pain. Plan to continue for 7-10 years given her lobular carcinoma histology. Her insurance preferred her to switch to Anastrozole in 04/2021, tolerating better without joint pain. -Screening breast MRI 05/26/2021 was negative, repeat q1-2 years; -Screening left mammogram 12/2021 was negative -Per patient DEXA was normal, we will request the report from [facility]  2. H/o Cervical Cancer, On situ carcinoma, Genetic Testing (negative) -Dx 1990 and treated at [facility] in [place], [place]. Pap smears have been benign since.  -She has family history of Endometrial cancer, ADH, and colon cancer. -She has negative genetic testing: No pathogenic variants detected on the [facility] Breast Cancer STAT panel. The report date is 01/02/2020.   3. Anxiety/Depression, Hot flashes -She had been anxious and depressed about her cancer diagnosis and recent weight gain.  -She started Effexor for mood and hot flashes from [facility], this was very effective however she weaned off and started Cymbalta for fibromyalgia -Cymbalta was not effective and she is back on Effexor, tolerating  -Mood is overall improved with intentional weight loss and stopping alcohol   4. Arthritis, Weight gain, Fibromyalgia, neuro symptoms (weakness, twitching, tingling, fall, balance issue) -She has chronic left hip and back pain since 2019. This resolved after shots to area and weight loss.  -She gained weight through [place] and with breast cancer diagnosis and antiestrogen therapy.  Her generalized body pain makes it  difficult to exercise -She is losing weight intentionally through [facility] and alcohol cessation -On 04/2021 at last visit had various neuro symptoms including muscle weakness and twitching, loss of balance, tongue tingling, and fall.  She was seen by Dr. Krista Blue, brain MRI 07/08/2021 was negative -Symptoms resolved through weight loss, alcohol cessation, depression and stress management   5. Smoking Cessation and health maintenance: - She was able to quit smoking in 02/2020.  -Last Pap 2 years ago -Colonoscopy scheduled 01/18/2022 with Dr. Gala Romney, 3 polyps removed; 5 year recall  -I referred her to derm at last visit    6. Bone Health, Vit D Deficiency  -She notes she is currently on prescription Vit D. When she completes, she can reduce to Vit D 1000-5000 units daily.  -Per pt her last DEXA was done locally 2 years ago.  -Vitamin D level 20.3 on 06/23/2021, currently she takes 5000 IU daily (35K IU per week).  I recommend to take 10,000 IU Monday Wednesday Friday and 5000 the rest of the week    PLAN:  No orders of the defined types were placed in this encounter.     All questions were answered. The patient knows to call the clinic with any problems, questions or concerns. No barriers to learning were detected. I spent *** counseling the patient face to face. The total time spent in the appointment was *** and more than 50% was on counseling, review of test results, and coordination of care.   Cira Rue, NP-C '@DATE'$ @

## 2022-10-06 ENCOUNTER — Inpatient Hospital Stay: Payer: BC Managed Care – PPO | Attending: Nurse Practitioner

## 2022-10-06 ENCOUNTER — Encounter: Payer: Self-pay | Admitting: Nurse Practitioner

## 2022-10-06 ENCOUNTER — Inpatient Hospital Stay (HOSPITAL_BASED_OUTPATIENT_CLINIC_OR_DEPARTMENT_OTHER): Payer: BC Managed Care – PPO | Admitting: Nurse Practitioner

## 2022-10-06 VITALS — BP 142/83 | HR 92 | Temp 98.8°F | Resp 18 | Ht 67.0 in | Wt 245.2 lb

## 2022-10-06 DIAGNOSIS — C50411 Malignant neoplasm of upper-outer quadrant of right female breast: Secondary | ICD-10-CM | POA: Insufficient documentation

## 2022-10-06 DIAGNOSIS — F418 Other specified anxiety disorders: Secondary | ICD-10-CM | POA: Diagnosis not present

## 2022-10-06 DIAGNOSIS — E559 Vitamin D deficiency, unspecified: Secondary | ICD-10-CM | POA: Insufficient documentation

## 2022-10-06 DIAGNOSIS — Z170 Estrogen receptor positive status [ER+]: Secondary | ICD-10-CM | POA: Diagnosis not present

## 2022-10-06 DIAGNOSIS — Z87891 Personal history of nicotine dependence: Secondary | ICD-10-CM | POA: Diagnosis not present

## 2022-10-06 DIAGNOSIS — I10 Essential (primary) hypertension: Secondary | ICD-10-CM | POA: Diagnosis not present

## 2022-10-06 DIAGNOSIS — Z1231 Encounter for screening mammogram for malignant neoplasm of breast: Secondary | ICD-10-CM

## 2022-10-06 DIAGNOSIS — Z800 Family history of malignant neoplasm of digestive organs: Secondary | ICD-10-CM | POA: Diagnosis not present

## 2022-10-06 DIAGNOSIS — Z79811 Long term (current) use of aromatase inhibitors: Secondary | ICD-10-CM | POA: Diagnosis not present

## 2022-10-06 DIAGNOSIS — M797 Fibromyalgia: Secondary | ICD-10-CM | POA: Diagnosis not present

## 2022-10-06 DIAGNOSIS — Z808 Family history of malignant neoplasm of other organs or systems: Secondary | ICD-10-CM | POA: Diagnosis not present

## 2022-10-06 LAB — CMP (CANCER CENTER ONLY)
ALT: 41 U/L (ref 0–44)
AST: 28 U/L (ref 15–41)
Albumin: 4.4 g/dL (ref 3.5–5.0)
Alkaline Phosphatase: 65 U/L (ref 38–126)
Anion gap: 7 (ref 5–15)
BUN: 19 mg/dL (ref 6–20)
CO2: 26 mmol/L (ref 22–32)
Calcium: 9.7 mg/dL (ref 8.9–10.3)
Chloride: 106 mmol/L (ref 98–111)
Creatinine: 0.64 mg/dL (ref 0.44–1.00)
GFR, Estimated: >60 mL/min (ref >60)
Glucose, Bld: 110 mg/dL — ABNORMAL HIGH (ref 70–99)
Potassium: 4.3 mmol/L (ref 3.5–5.1)
Sodium: 139 mmol/L (ref 135–145)
Total Bilirubin: 0.6 mg/dL (ref 0.3–1.2)
Total Protein: 6.9 g/dL (ref 6.5–8.1)

## 2022-10-06 LAB — CBC WITH DIFFERENTIAL (CANCER CENTER ONLY)
Abs Immature Granulocytes: 0.03 10*3/uL (ref 0.00–0.07)
Basophils Absolute: 0.1 10*3/uL (ref 0.0–0.1)
Basophils Relative: 1 %
Eosinophils Absolute: 0.2 10*3/uL (ref 0.0–0.5)
Eosinophils Relative: 2 %
HCT: 42.1 % (ref 36.0–46.0)
Hemoglobin: 14.7 g/dL (ref 12.0–15.0)
Immature Granulocytes: 0 %
Lymphocytes Relative: 32 %
Lymphs Abs: 2.2 10*3/uL (ref 0.7–4.0)
MCH: 35 pg — ABNORMAL HIGH (ref 26.0–34.0)
MCHC: 34.9 g/dL (ref 30.0–36.0)
MCV: 100.2 fL — ABNORMAL HIGH (ref 80.0–100.0)
Monocytes Absolute: 0.4 10*3/uL (ref 0.1–1.0)
Monocytes Relative: 6 %
Neutro Abs: 4.2 10*3/uL (ref 1.7–7.7)
Neutrophils Relative %: 59 %
Platelet Count: 236 10*3/uL (ref 150–400)
RBC: 4.2 MIL/uL (ref 3.87–5.11)
RDW: 12 % (ref 11.5–15.5)
WBC Count: 7.1 10*3/uL (ref 4.0–10.5)
nRBC: 0 % (ref 0.0–0.2)

## 2022-10-06 MED ORDER — ANASTROZOLE 1 MG PO TABS: 1.0000 mg | ORAL_TABLET | Freq: Every day | ORAL | 3 refills | Status: DC

## 2023-01-20 ENCOUNTER — Other Ambulatory Visit: Payer: Self-pay | Admitting: Nurse Practitioner

## 2023-01-21 ENCOUNTER — Ambulatory Visit
Admission: RE | Admit: 2023-01-21 | Discharge: 2023-01-21 | Disposition: A | Discharge status: Home / Self Care | Payer: BC Managed Care – PPO | Source: Ambulatory Visit | Attending: Nurse Practitioner | Admitting: Nurse Practitioner

## 2023-01-21 DIAGNOSIS — Z1231 Encounter for screening mammogram for malignant neoplasm of breast: Secondary | ICD-10-CM

## 2023-04-06 ENCOUNTER — Other Ambulatory Visit: Payer: Self-pay

## 2023-04-06 DIAGNOSIS — Z170 Estrogen receptor positive status [ER+]: Secondary | ICD-10-CM

## 2023-04-07 ENCOUNTER — Inpatient Hospital Stay: Payer: BC Managed Care – PPO | Admitting: Nurse Practitioner

## 2023-04-07 ENCOUNTER — Inpatient Hospital Stay: Payer: BC Managed Care – PPO

## 2023-05-13 ENCOUNTER — Other Ambulatory Visit: Payer: Self-pay | Admitting: Nurse Practitioner

## 2023-05-15 NOTE — Progress Notes (Deleted)
Patient Care Team: Bjorn Pippin, MD as PCP - General (Neurology) Donnelly Angelica, RN as Oncology Nurse Navigator Pershing Proud, RN as Oncology Nurse Navigator Manus Rudd, MD as Consulting Physician (General Surgery) Malachy Mood, MD as Consulting Physician (Hematology) Dorothy Puffer, MD as Consulting Physician (Radiation Oncology) Pollyann Samples, NP as Nurse Practitioner (Nurse Practitioner)   CHIEF COMPLAINT: Follow up right breast cancer   Oncology History Overview Note  Cancer Staging Malignant neoplasm of upper-outer quadrant of right breast in female, estrogen receptor positive [facility]) Staging form: Breast, AJCC 8th Edition - Clinical stage from 12/26/2019: Stage IB (cT2, cN0, cM0, G2, ER+, PR+, HER2-) - Unsigned - Pathologic stage from 02/22/2020: Stage IA (pT2, pN0, cM0, G2, ER+, PR+, HER2-) - Signed by Pollyann Samples, NP on 06/09/2020    Malignant neoplasm of upper-outer quadrant of right breast in female, estrogen receptor positive (HCC)  12/20/2019 Initial Biopsy   Diagnosis 1. Breast, right, needle core biopsy, 7:30 o'clock, 4 cmfn, ribbon clip - FIBROCYSTIC CHANGES. - THERE IS NO EVIDENCE OF MALIGNANCY. - SEE COMMENT. 2. Breast, right, needle core biopsy, 9 o'clock, 5 cmfn, coil clip - INVASIVE MAMMARY CARCINOMA. - SEE COMMENT. 3. Breast, right, needle core biopsy, outer, x clip - INVASIVE MAMMARY CARCINOMA. - ATYPICAL LOBULAR HYPERPLASIA WITH CALCIFICATIONS. - SEE COMMENT. Microscopic Comment 2. The carcinoma appears at least grade 2. The longest span of tumor is 1.2 cm. An E-Cadherin and a breast prognostic profile will be performed on part 2 and the results reported separately. Dr. Rayetta Pigg has reviewed part 2 and concurs with this interpretation. The results were called to [facility] on 12/21/2019. 3. The carcinoma in part 3 is morphologically dissimilar from that in part 2. The longest span of tumor is 0.1 cm. An E-Cadherin and a breast  prognostic profile will also be performed on part 3 and the results reported separately. (JBK:ah 12/21/19)   12/20/2019 Receptors her2   2. PROGNOSTIC INDICATORS Results: IMMUNOHISTOCHEMICAL AND MORPHOMETRIC ANALYSIS PERFORMED MANUALLY The tumor cells are NEGATIVE for Her2 (1+). Estrogen Receptor: 95%, POSITIVE, STRONG STAINING INTENSITY Progesterone Receptor: 95%, POSITIVE, STRONG STAINING INTENSITY Proliferation Marker Ki67: 15%   3. Prognostic indicators:  HER2 NEGATIVE  ER: 95% POSITIVE STRONG STAINING PR 90% POSITIVE STRONG STAINING  Proliferation Marker Ki67: 20%   12/20/2019 Mammogram   Diagnostic Mammogram  IMPRESSION    12/25/2019 Initial Diagnosis   Malignant neoplasm of upper-outer quadrant of right breast in female, estrogen receptor positive (HCC)   12/29/2019 Breast MRI   IMPRESSION: 1. 2.5 cm spiculated mass in the upper outer right breast at middle depth consistent with the patient's biopsy-proven malignancy (coil clip). 2. Additional 9 mm enhancing mass located approximately 3 cm inferior to the index lesion. This is seen adjacent to a biopsy tract in the central right breast and may represents the additional biopsied focus of malignancy. The associated X-shaped clip was displaced 4 cm medially at the time of biopsy. 3. No MRI evidence of malignancy on the left. 4. No suspicious lymphadenopathy.   01/02/2020 Genetic Testing   Negative genetic testing:  No pathogenic variants detected on the [facility] Breast Cancer STAT panel or the Common Hereditary Cancers panel. The report dates are 01/02/2020 and 01/08/2020, respectively.  The Breast Cancer STAT Panel offered by [facility] includes sequencing and deletion/duplication analysis for the following 9 genes:  ATM, BRCA1, BRCA2, CDH1, CHEK2, PALB2, PTEN, STK11 and TP53. The Common Hereditary Cancers Panel offered by [facility] includes  sequencing and/or deletion duplication testing of the following 47 genes: APC, ATM, AXIN2,  BARD1, BMPR1A, BRCA1, BRCA2, BRIP1, CDH1, CDK4, CDKN2A (p14ARF), CDKN2A (p16INK4a), CHEK2, CTNNA1, DICER1, EPCAM (Deletion/duplication testing only), GREM1 (promoter region deletion/duplication testing only), KIT, MEN1, MLH1, MSH2, MSH3, MSH6, MUTYH, NBN, NF1, NTHL1, PALB2, PDGFRA, PMS2, POLD1, POLE, PTEN, RAD50, RAD51C, RAD51D, SDHB, SDHC, SDHD, SMAD4, SMARCA4. STK11, TP53, TSC1, TSC2, and VHL.  The following genes were evaluated for sequence changes only: SDHA and HOXB13 c.251G>A variant only.   01/16/2020 Pathology Results   Diagnosis Breast, right, needle core biopsy, mid to post 1/3 out mid below known cancer - INVASIVE AND IN SITU LOBULAR CARCINOMA. - SEE MICROSCOPIC DESCRIPTION. Microscopic Comment The greatest dimension in a single core is 0.8 cm.   01/22/2020 -  Neo-Adjuvant Anti-estrogen oral therapy   Exemestane 25mg  once daily starting in 01/22/20   02/22/2020 Surgery   RIGHT MASTECTOMY WITH SENTINEL LYMPH NODE BIOPSY   02/22/2020 Pathology Results   FINAL MICROSCOPIC DIAGNOSIS:   A. BREAST, RIGHT, MASTECTOMY:  - Invasive lobular carcinoma, grade 2, spanning 4.1 cm.  - Lobular carcinoma in situ.  - Anterior margin is focally positive for invasive carcinoma.  - Perineural invasion.  - Lymphovascular invasion.  - See oncology table.   B. LYMPH NODE, RIGHT AXILLARY #1, SENTINEL, BIOPSY:  - One of one lymph nodes negative for carcinoma (0/1).   C. SKIN, RIGHT MASTECTOMY ADDITIONAL INFERIOR FLAP, EXCISION:  - Focal invasive lobular carcinoma, 2 mm, associated with lymphovascular  invasion.  - Margin is negative for carcinoma.    02/22/2020 Oncotype testing   RS 23  Distant risk of recurrence 9% Less than 1% benefit of chemotherapy   02/22/2020 Cancer Staging   Staging form: Breast, AJCC 8th Edition - Pathologic stage from 02/22/2020: Stage IA (pT2, pN0, cM0, G2, ER+, PR+, HER2-) - Signed by Pollyann Samples, NP on 06/09/2020   08/08/2020 Survivorship   SCP mailed to patient  08/08/2020      CURRENT THERAPY: Exemestane starting 01/22/20, changed to anastrozole 05/2021 due to insurance coverage   INTERVAL HISTORY Cynthia Shaw returns for follow up as scheduled. Last seen by me 10/06/22. Mammogram 12/2022 was benign. She continues anastrozole   ROS   Past Medical History:  Diagnosis Date   Anxiety    Cancer (HCC) 12/2019   right breast invasive lobular cancer   Depression    Family history of colon cancer    Family history of melanoma    Family history of throat cancer    Family history of uterine cancer    Hypertension      Past Surgical History:  Procedure Laterality Date   c sections      x3   CESAREAN SECTION     COLONOSCOPY WITH PROPOFOL N/A 01/18/2022   Procedure: COLONOSCOPY WITH PROPOFOL;  Surgeon: Corbin Ade, MD;  Location: [facility];  Service: Endoscopy;  Laterality: N/A;  8:15 / ASA 2   KNEE ARTHROSCOPY Left    MASTECTOMY W/ SENTINEL NODE BIOPSY Right 02/22/2020   Procedure: RIGHT MASTECTOMY WITH SENTINEL LYMPH NODE BIOPSY;  Surgeon: Manus Rudd, MD;  Location: [facility];  Service: General;  Laterality: Right;  PEC BLOCK   POLYPECTOMY  01/18/2022   Procedure: POLYPECTOMY;  Surgeon: Corbin Ade, MD;  Location: [facility];  Service: Endoscopy;;   TONSILLECTOMY       Outpatient Encounter Medications as of 05/16/2023  Medication Sig   anastrozole (ARIMIDEX) 1 MG tablet Take  1 tablet (1 mg total) by mouth daily.   Cholecalciferol (VITAMIN D3) 125 MCG (5000 UT) CAPS Take 5,000 Units by mouth daily at 6 (six) AM.   Coenzyme Q10 (CO Q-10) 100 MG CAPS Take 100 mg by mouth daily.   cyclobenzaprine (FLEXERIL) 10 MG tablet Take 10 mg by mouth at bedtime.   ibuprofen (ADVIL) 800 MG tablet Take 800 mg by mouth daily as needed (fibromyalgia).   lisinopril (ZESTRIL) 20 MG tablet Take 20 mg by mouth daily.   rosuvastatin (CRESTOR) 10 MG tablet Take 10 mg by mouth at bedtime.   venlafaxine XR (EFFEXOR-XR) 75 MG 24 hr  capsule TAKE ONE CAPSULE BY MOUTH DAILY WITH BREAKFAST   No facility-administered encounter medications on file as of 05/16/2023.     There were no vitals filed for this visit. There is no height or weight on file to calculate BMI.   PHYSICAL EXAM GENERAL:alert, no distress and comfortable SKIN: no rash  EYES: sclera clear NECK: without mass LYMPH:  no palpable cervical or supraclavicular lymphadenopathy  LUNGS: clear with normal breathing effort HEART: regular rate & rhythm, no lower extremity edema ABDOMEN: abdomen soft, non-tender and normal bowel sounds NEURO: alert & oriented x 3 with fluent speech, no focal motor/sensory deficits Breast exam:  PAC without erythema    CBC    Component Value Date/Time   WBC 7.1 10/06/2022 0927   RBC 4.20 10/06/2022 0927   HGB 14.7 10/06/2022 0927   HCT 42.1 10/06/2022 0927   PLT 236 10/06/2022 0927   MCV 100.2 (H) 10/06/2022 0927   MCH 35.0 (H) 10/06/2022 0927   MCHC 34.9 10/06/2022 0927   RDW 12.0 10/06/2022 0927   LYMPHSABS 2.2 10/06/2022 0927   MONOABS 0.4 10/06/2022 0927   EOSABS 0.2 10/06/2022 0927   BASOSABS 0.1 10/06/2022 0927     CMP     Component Value Date/Time   NA 139 10/06/2022 0927   K 4.3 10/06/2022 0927   CL 106 10/06/2022 0927   CO2 26 10/06/2022 0927   GLUCOSE 110 (H) 10/06/2022 0927   BUN 19 10/06/2022 0927   CREATININE 0.64 10/06/2022 0927   CALCIUM 9.7 10/06/2022 0927   PROT 6.9 10/06/2022 0927   ALBUMIN 4.4 10/06/2022 0927   AST 28 10/06/2022 0927   ALT 41 10/06/2022 0927   ALKPHOS 65 10/06/2022 0927   BILITOT 0.6 10/06/2022 0927   GFRNONAA >60 10/06/2022 0927   GFRAA >60 12/26/2019 1208     ASSESSMENT & PLAN:Cynthia Shaw is a 62 y.o. female with        1. 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 Given her multifocal disease, she underwent  right mastectomy with Dr. Corliss Skains on 02/22/20. Her Oncotype RS was 23. Due to the low risk disease, adjuvant chemotherapy was not recommended.  She did not require post mastectomy radiation. -She started Exemestane before her breast surgery on 01/22/20, insurance switched her to Anastrozole in 04/2021 which she tolerates much better. Plan to continue for total of 7-10 years given her lobular carcinoma histology.  -Screening breast MRI 05/26/2021 was negative, repeat q1-2 years; -Screening left mammogram 01/21/23 was negative    2. H/o Cervical Cancer, On situ carcinoma, Genetic Testing (negative) -Dx 1990 and treated at [facility] in [place], [place]. Pap smears have been benign since.  -She has family history of Endometrial cancer, ADH, and colon cancer. -  She has negative genetic testing: No pathogenic variants detected on the [facility] Breast Cancer STAT panel. The report date is 01/02/2020.   3. Anxiety/Depression, Hot flashes -She had been anxious and depressed about her cancer diagnosis and recent weight gain.  -She started Effexor for mood and hot flashes from [facility], this was very effective however she weaned off and started Cymbalta for fibromyalgia -Cymbalta was not effective and she is back on Effexor, tolerating  -Anxiety in the morning, relived with effexor   4. Arthritis, Weight gain, Fibromyalgia -She has chronic left hip and back pain since 2019. This resolved after shots to area and weight loss.  -She gained weight through COVID and with breast cancer diagnosis and antiestrogen therapy.  Her generalized body pain makes it difficult to exercise -She is losing weight intentionally through Noom and alcohol cessation -She gained weight recently from travel, stress. Still using Noom. Knows what she needs to do to get back on track    5. Smoking Cessation and health maintenance: - She was able to quit smoking in 02/2020.  -Last Pap 2 years ago, will update at upcoming annual wellness  physical -Colonoscopy 01/18/2022 with Dr. Jena Gauss, 3 polyps removed (TA's); 5 year recall  -she also had derm screening last year, no biopsies   6. Bone Health, Vit D Deficiency  -She was on high dose vit D in the past, currently taking 2000 IU daily. Will repeat at PCP this month -Per pt her last DEXA was normal, continue per PCP         PLAN:  No orders of the defined types were placed in this encounter.     All questions were answered. The patient knows to call the clinic with any problems, questions or concerns. No barriers to learning were detected. I spent *** counseling the patient face to face. The total time spent in the appointment was *** and more than 50% was on counseling, review of test results, and coordination of care.   Santiago Glad, NP-C @DATE @

## 2023-05-16 ENCOUNTER — Telehealth: Payer: Self-pay

## 2023-05-16 ENCOUNTER — Telehealth: Payer: Self-pay | Admitting: Nurse Practitioner

## 2023-05-16 ENCOUNTER — Inpatient Hospital Stay: Payer: BC Managed Care – PPO

## 2023-05-16 ENCOUNTER — Inpatient Hospital Stay: Payer: BC Managed Care – PPO | Admitting: Nurse Practitioner

## 2023-05-16 NOTE — Telephone Encounter (Signed)
Called patient to see if she was coming for her 830 appointment with Santiago Glad NP

## 2023-05-21 NOTE — Progress Notes (Deleted)
Patient Care Team: Bjorn Pippin, MD as PCP - General (Neurology) Donnelly Angelica, RN as Oncology Nurse Navigator Pershing Proud, RN as Oncology Nurse Navigator Manus Rudd, MD as Consulting Physician (General Surgery) Malachy Mood, MD as Consulting Physician (Hematology) Dorothy Puffer, MD as Consulting Physician (Radiation Oncology) Pollyann Samples, NP as Nurse Practitioner (Nurse Practitioner)   CHIEF COMPLAINT: Follow up right breast cancer   Oncology History Overview Note  Cancer Staging Malignant neoplasm of upper-outer quadrant of right breast in female, estrogen receptor positive [facility]) Staging form: Breast, AJCC 8th Edition - Clinical stage from 12/26/2019: Stage IB (cT2, cN0, cM0, G2, ER+, PR+, HER2-) - Unsigned - Pathologic stage from 02/22/2020: Stage IA (pT2, pN0, cM0, G2, ER+, PR+, HER2-) - Signed by Pollyann Samples, NP on 06/09/2020    Malignant neoplasm of upper-outer quadrant of right breast in female, estrogen receptor positive (HCC)  12/20/2019 Initial Biopsy   Diagnosis 1. Breast, right, needle core biopsy, 7:30 o'clock, 4 cmfn, ribbon clip - FIBROCYSTIC CHANGES. - THERE IS NO EVIDENCE OF MALIGNANCY. - SEE COMMENT. 2. Breast, right, needle core biopsy, 9 o'clock, 5 cmfn, coil clip - INVASIVE MAMMARY CARCINOMA. - SEE COMMENT. 3. Breast, right, needle core biopsy, outer, x clip - INVASIVE MAMMARY CARCINOMA. - ATYPICAL LOBULAR HYPERPLASIA WITH CALCIFICATIONS. - SEE COMMENT. Microscopic Comment 2. The carcinoma appears at least grade 2. The longest span of tumor is 1.2 cm. An E-Cadherin and a breast prognostic profile will be performed on part 2 and the results reported separately. Dr. Rayetta Pigg has reviewed part 2 and concurs with this interpretation. The results were called to [facility] on 12/21/2019. 3. The carcinoma in part 3 is morphologically dissimilar from that in part 2. The longest span of tumor is 0.1 cm. An E-Cadherin and a breast  prognostic profile will also be performed on part 3 and the results reported separately. (JBK:ah 12/21/19)   12/20/2019 Receptors her2   2. PROGNOSTIC INDICATORS Results: IMMUNOHISTOCHEMICAL AND MORPHOMETRIC ANALYSIS PERFORMED MANUALLY The tumor cells are NEGATIVE for Her2 (1+). Estrogen Receptor: 95%, POSITIVE, STRONG STAINING INTENSITY Progesterone Receptor: 95%, POSITIVE, STRONG STAINING INTENSITY Proliferation Marker Ki67: 15%   3. Prognostic indicators:  HER2 NEGATIVE  ER: 95% POSITIVE STRONG STAINING PR 90% POSITIVE STRONG STAINING  Proliferation Marker Ki67: 20%   12/20/2019 Mammogram   Diagnostic Mammogram  IMPRESSION    12/25/2019 Initial Diagnosis   Malignant neoplasm of upper-outer quadrant of right breast in female, estrogen receptor positive (HCC)   12/29/2019 Breast MRI   IMPRESSION: 1. 2.5 cm spiculated mass in the upper outer right breast at middle depth consistent with the patient's biopsy-proven malignancy (coil clip). 2. Additional 9 mm enhancing mass located approximately 3 cm inferior to the index lesion. This is seen adjacent to a biopsy tract in the central right breast and may represents the additional biopsied focus of malignancy. The associated X-shaped clip was displaced 4 cm medially at the time of biopsy. 3. No MRI evidence of malignancy on the left. 4. No suspicious lymphadenopathy.   01/02/2020 Genetic Testing   Negative genetic testing:  No pathogenic variants detected on the [facility] Breast Cancer STAT panel or the Common Hereditary Cancers panel. The report dates are 01/02/2020 and 01/08/2020, respectively.  The Breast Cancer STAT Panel offered by [facility] includes sequencing and deletion/duplication analysis for the following 9 genes:  ATM, BRCA1, BRCA2, CDH1, CHEK2, PALB2, PTEN, STK11 and TP53. The Common Hereditary Cancers Panel offered by [facility] includes  sequencing and/or deletion duplication testing of the following 47 genes: APC, ATM, AXIN2,  BARD1, BMPR1A, BRCA1, BRCA2, BRIP1, CDH1, CDK4, CDKN2A (p14ARF), CDKN2A (p16INK4a), CHEK2, CTNNA1, DICER1, EPCAM (Deletion/duplication testing only), GREM1 (promoter region deletion/duplication testing only), KIT, MEN1, MLH1, MSH2, MSH3, MSH6, MUTYH, NBN, NF1, NTHL1, PALB2, PDGFRA, PMS2, POLD1, POLE, PTEN, RAD50, RAD51C, RAD51D, SDHB, SDHC, SDHD, SMAD4, SMARCA4. STK11, TP53, TSC1, TSC2, and VHL.  The following genes were evaluated for sequence changes only: SDHA and HOXB13 c.251G>A variant only.   01/16/2020 Pathology Results   Diagnosis Breast, right, needle core biopsy, mid to post 1/3 out mid below known cancer - INVASIVE AND IN SITU LOBULAR CARCINOMA. - SEE MICROSCOPIC DESCRIPTION. Microscopic Comment The greatest dimension in a single core is 0.8 cm.   01/22/2020 -  Neo-Adjuvant Anti-estrogen oral therapy   Exemestane 25mg  once daily starting in 01/22/20   02/22/2020 Surgery   RIGHT MASTECTOMY WITH SENTINEL LYMPH NODE BIOPSY   02/22/2020 Pathology Results   FINAL MICROSCOPIC DIAGNOSIS:   A. BREAST, RIGHT, MASTECTOMY:  - Invasive lobular carcinoma, grade 2, spanning 4.1 cm.  - Lobular carcinoma in situ.  - Anterior margin is focally positive for invasive carcinoma.  - Perineural invasion.  - Lymphovascular invasion.  - See oncology table.   B. LYMPH NODE, RIGHT AXILLARY #1, SENTINEL, BIOPSY:  - One of one lymph nodes negative for carcinoma (0/1).   C. SKIN, RIGHT MASTECTOMY ADDITIONAL INFERIOR FLAP, EXCISION:  - Focal invasive lobular carcinoma, 2 mm, associated with lymphovascular  invasion.  - Margin is negative for carcinoma.    02/22/2020 Oncotype testing   RS 23  Distant risk of recurrence 9% Less than 1% benefit of chemotherapy   02/22/2020 Cancer Staging   Staging form: Breast, AJCC 8th Edition - Pathologic stage from 02/22/2020: Stage IA (pT2, pN0, cM0, G2, ER+, PR+, HER2-) - Signed by Pollyann Samples, NP on 06/09/2020   08/08/2020 Survivorship   SCP mailed to patient  08/08/2020      CURRENT THERAPY: Exemestane starting 01/22/20, changed to anastrozole 05/2021 due to insurance coverage   INTERVAL HISTORY Cynthia Shaw returns for follow up as scheduled. Last seen by me 10/06/22. Mammogram 12/2022 was benign. She continues anastrozole   ROS   Past Medical History:  Diagnosis Date   Anxiety    Cancer (HCC) 12/2019   right breast invasive lobular cancer   Depression    Family history of colon cancer    Family history of melanoma    Family history of throat cancer    Family history of uterine cancer    Hypertension      Past Surgical History:  Procedure Laterality Date   c sections      x3   CESAREAN SECTION     COLONOSCOPY WITH PROPOFOL N/A 01/18/2022   Procedure: COLONOSCOPY WITH PROPOFOL;  Surgeon: Corbin Ade, MD;  Location: [facility];  Service: Endoscopy;  Laterality: N/A;  8:15 / ASA 2   KNEE ARTHROSCOPY Left    MASTECTOMY W/ SENTINEL NODE BIOPSY Right 02/22/2020   Procedure: RIGHT MASTECTOMY WITH SENTINEL LYMPH NODE BIOPSY;  Surgeon: Manus Rudd, MD;  Location: [facility];  Service: General;  Laterality: Right;  PEC BLOCK   POLYPECTOMY  01/18/2022   Procedure: POLYPECTOMY;  Surgeon: Corbin Ade, MD;  Location: [facility];  Service: Endoscopy;;   TONSILLECTOMY       Outpatient Encounter Medications as of 05/24/2023  Medication Sig   anastrozole (ARIMIDEX) 1 MG tablet Take  1 tablet (1 mg total) by mouth daily.   Cholecalciferol (VITAMIN D3) 125 MCG (5000 UT) CAPS Take 5,000 Units by mouth daily at 6 (six) AM.   Coenzyme Q10 (CO Q-10) 100 MG CAPS Take 100 mg by mouth daily.   cyclobenzaprine (FLEXERIL) 10 MG tablet Take 10 mg by mouth at bedtime.   ibuprofen (ADVIL) 800 MG tablet Take 800 mg by mouth daily as needed (fibromyalgia).   lisinopril (ZESTRIL) 20 MG tablet Take 20 mg by mouth daily.   rosuvastatin (CRESTOR) 10 MG tablet Take 10 mg by mouth at bedtime.   venlafaxine XR (EFFEXOR-XR) 75 MG 24 hr  capsule TAKE ONE CAPSULE BY MOUTH DAILY WITH BREAKFAST   No facility-administered encounter medications on file as of 05/24/2023.     There were no vitals filed for this visit. There is no height or weight on file to calculate BMI.   PHYSICAL EXAM GENERAL:alert, no distress and comfortable SKIN: no rash  EYES: sclera clear NECK: without mass LYMPH:  no palpable cervical or supraclavicular lymphadenopathy  LUNGS: clear with normal breathing effort HEART: regular rate & rhythm, no lower extremity edema ABDOMEN: abdomen soft, non-tender and normal bowel sounds NEURO: alert & oriented x 3 with fluent speech, no focal motor/sensory deficits Breast exam:  PAC without erythema    CBC    Component Value Date/Time   WBC 7.1 10/06/2022 0927   RBC 4.20 10/06/2022 0927   HGB 14.7 10/06/2022 0927   HCT 42.1 10/06/2022 0927   PLT 236 10/06/2022 0927   MCV 100.2 (H) 10/06/2022 0927   MCH 35.0 (H) 10/06/2022 0927   MCHC 34.9 10/06/2022 0927   RDW 12.0 10/06/2022 0927   LYMPHSABS 2.2 10/06/2022 0927   MONOABS 0.4 10/06/2022 0927   EOSABS 0.2 10/06/2022 0927   BASOSABS 0.1 10/06/2022 0927     CMP     Component Value Date/Time   NA 139 10/06/2022 0927   K 4.3 10/06/2022 0927   CL 106 10/06/2022 0927   CO2 26 10/06/2022 0927   GLUCOSE 110 (H) 10/06/2022 0927   BUN 19 10/06/2022 0927   CREATININE 0.64 10/06/2022 0927   CALCIUM 9.7 10/06/2022 0927   PROT 6.9 10/06/2022 0927   ALBUMIN 4.4 10/06/2022 0927   AST 28 10/06/2022 0927   ALT 41 10/06/2022 0927   ALKPHOS 65 10/06/2022 0927   BILITOT 0.6 10/06/2022 0927   GFRNONAA >60 10/06/2022 0927   GFRAA >60 12/26/2019 1208     ASSESSMENT & PLAN:Cynthia Shaw is a 62 y.o. female with        1. 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 Given her multifocal disease, she underwent  right mastectomy with Dr. Corliss Skains on 02/22/20. Her Oncotype RS was 23. Due to the low risk disease, adjuvant chemotherapy was not recommended.  She did not require post mastectomy radiation. -She started Exemestane before her breast surgery on 01/22/20, insurance switched her to Anastrozole in 04/2021 which she tolerates much better. Plan to continue for total of 7-10 years given her lobular carcinoma histology.  -Screening breast MRI 05/26/2021 was negative, repeat q1-2 years; -Screening left mammogram 01/21/23 was negative    2. H/o Cervical Cancer, On situ carcinoma, Genetic Testing (negative) -Dx 1990 and treated at [facility] in [place], [place]. Pap smears have been benign since.  -She has family history of Endometrial cancer, ADH, and colon cancer. -  She has negative genetic testing: No pathogenic variants detected on the [facility] Breast Cancer STAT panel. The report date is 01/02/2020.   3. Anxiety/Depression, Hot flashes -She had been anxious and depressed about her cancer diagnosis and recent weight gain.  -She started Effexor for mood and hot flashes from [facility], this was very effective however she weaned off and started Cymbalta for fibromyalgia -Cymbalta was not effective and she is back on Effexor, tolerating  -Anxiety in the morning, relived with effexor   4. Arthritis, Weight gain, Fibromyalgia -She has chronic left hip and back pain since 2019. This resolved after shots to area and weight loss.  -She gained weight through COVID and with breast cancer diagnosis and antiestrogen therapy.  Her generalized body pain makes it difficult to exercise -She is losing weight intentionally through Noom and alcohol cessation -She gained weight recently from travel, stress. Still using Noom. Knows what she needs to do to get back on track    5. Smoking Cessation and health maintenance: - She was able to quit smoking in 02/2020.  -Last Pap 2 years ago, will update at upcoming annual wellness  physical -Colonoscopy 01/18/2022 with Dr. Jena Gauss, 3 polyps removed (TA's); 5 year recall  -she also had derm screening last year, no biopsies   6. Bone Health, Vit D Deficiency  -She was on high dose vit D in the past, currently taking 2000 IU daily. Will repeat at PCP this month -Per pt her last DEXA was normal, continue per PCP         PLAN:  No orders of the defined types were placed in this encounter.     All questions were answered. The patient knows to call the clinic with any problems, questions or concerns. No barriers to learning were detected. I spent *** counseling the patient face to face. The total time spent in the appointment was *** and more than 50% was on counseling, review of test results, and coordination of care.   Cynthia Glad, NP-C @DATE @

## 2023-05-24 ENCOUNTER — Inpatient Hospital Stay: Payer: BC Managed Care – PPO | Admitting: Nurse Practitioner

## 2023-05-24 ENCOUNTER — Inpatient Hospital Stay: Payer: BC Managed Care – PPO | Attending: Neurology

## 2023-07-03 NOTE — Progress Notes (Unsigned)
Patient Care Team: Bjorn Pippin, MD as PCP - General (Neurology) Donnelly Angelica, RN as Oncology Nurse Navigator Pershing Proud, RN as Oncology Nurse Navigator Manus Rudd, MD as Consulting Physician (General Surgery) Malachy Mood, MD as Consulting Physician (Hematology) Dorothy Puffer, MD as Consulting Physician (Radiation Oncology) Pollyann Samples, NP as Nurse Practitioner (Nurse Practitioner)   CHIEF COMPLAINT: Follow up right breast cancer   Oncology History Overview Note  Cancer Staging Malignant neoplasm of upper-outer quadrant of right breast in female, estrogen receptor positive [facility]) Staging form: Breast, AJCC 8th Edition - Clinical stage from 12/26/2019: Stage IB (cT2, cN0, cM0, G2, ER+, PR+, HER2-) - Unsigned - Pathologic stage from 02/22/2020: Stage IA (pT2, pN0, cM0, G2, ER+, PR+, HER2-) - Signed by Pollyann Samples, NP on 06/09/2020    Malignant neoplasm of upper-outer quadrant of right breast in female, estrogen receptor positive (HCC)  12/20/2019 Initial Biopsy   Diagnosis 1. Breast, right, needle core biopsy, 7:30 o'clock, 4 cmfn, ribbon clip - FIBROCYSTIC CHANGES. - THERE IS NO EVIDENCE OF MALIGNANCY. - SEE COMMENT. 2. Breast, right, needle core biopsy, 9 o'clock, 5 cmfn, coil clip - INVASIVE MAMMARY CARCINOMA. - SEE COMMENT. 3. Breast, right, needle core biopsy, outer, x clip - INVASIVE MAMMARY CARCINOMA. - ATYPICAL LOBULAR HYPERPLASIA WITH CALCIFICATIONS. - SEE COMMENT. Microscopic Comment 2. The carcinoma appears at least grade 2. The longest span of tumor is 1.2 cm. An E-Cadherin and a breast prognostic profile will be performed on part 2 and the results reported separately. Dr. Rayetta Pigg has reviewed part 2 and concurs with this interpretation. The results were called to [facility] on 12/21/2019. 3. The carcinoma in part 3 is morphologically dissimilar from that in part 2. The longest span of tumor is 0.1 cm. An E-Cadherin and a breast  prognostic profile will also be performed on part 3 and the results reported separately. (JBK:ah 12/21/19)   12/20/2019 Receptors her2   2. PROGNOSTIC INDICATORS Results: IMMUNOHISTOCHEMICAL AND MORPHOMETRIC ANALYSIS PERFORMED MANUALLY The tumor cells are NEGATIVE for Her2 (1+). Estrogen Receptor: 95%, POSITIVE, STRONG STAINING INTENSITY Progesterone Receptor: 95%, POSITIVE, STRONG STAINING INTENSITY Proliferation Marker Ki67: 15%   3. Prognostic indicators:  HER2 NEGATIVE  ER: 95% POSITIVE STRONG STAINING PR 90% POSITIVE STRONG STAINING  Proliferation Marker Ki67: 20%   12/20/2019 Mammogram   Diagnostic Mammogram  IMPRESSION    12/25/2019 Initial Diagnosis   Malignant neoplasm of upper-outer quadrant of right breast in female, estrogen receptor positive (HCC)   12/29/2019 Breast MRI   IMPRESSION: 1. 2.5 cm spiculated mass in the upper outer right breast at middle depth consistent with the patient's biopsy-proven malignancy (coil clip). 2. Additional 9 mm enhancing mass located approximately 3 cm inferior to the index lesion. This is seen adjacent to a biopsy tract in the central right breast and may represents the additional biopsied focus of malignancy. The associated X-shaped clip was displaced 4 cm medially at the time of biopsy. 3. No MRI evidence of malignancy on the left. 4. No suspicious lymphadenopathy.   01/02/2020 Genetic Testing   Negative genetic testing:  No pathogenic variants detected on the [facility] Breast Cancer STAT panel or the Common Hereditary Cancers panel. The report dates are 01/02/2020 and 01/08/2020, respectively.  The Breast Cancer STAT Panel offered by [facility] includes sequencing and deletion/duplication analysis for the following 9 genes:  ATM, BRCA1, BRCA2, CDH1, CHEK2, PALB2, PTEN, STK11 and TP53. The Common Hereditary Cancers Panel offered by [facility] includes  sequencing and/or deletion duplication testing of the following 47 genes: APC, ATM, AXIN2,  BARD1, BMPR1A, BRCA1, BRCA2, BRIP1, CDH1, CDK4, CDKN2A (p14ARF), CDKN2A (p16INK4a), CHEK2, CTNNA1, DICER1, EPCAM (Deletion/duplication testing only), GREM1 (promoter region deletion/duplication testing only), KIT, MEN1, MLH1, MSH2, MSH3, MSH6, MUTYH, NBN, NF1, NTHL1, PALB2, PDGFRA, PMS2, POLD1, POLE, PTEN, RAD50, RAD51C, RAD51D, SDHB, SDHC, SDHD, SMAD4, SMARCA4. STK11, TP53, TSC1, TSC2, and VHL.  The following genes were evaluated for sequence changes only: SDHA and HOXB13 c.251G>A variant only.   01/16/2020 Pathology Results   Diagnosis Breast, right, needle core biopsy, mid to post 1/3 out mid below known cancer - INVASIVE AND IN SITU LOBULAR CARCINOMA. - SEE MICROSCOPIC DESCRIPTION. Microscopic Comment The greatest dimension in a single core is 0.8 cm.   01/22/2020 -  Neo-Adjuvant Anti-estrogen oral therapy   Exemestane 25mg  once daily starting in 01/22/20   02/22/2020 Surgery   RIGHT MASTECTOMY WITH SENTINEL LYMPH NODE BIOPSY   02/22/2020 Pathology Results   FINAL MICROSCOPIC DIAGNOSIS:   A. BREAST, RIGHT, MASTECTOMY:  - Invasive lobular carcinoma, grade 2, spanning 4.1 cm.  - Lobular carcinoma in situ.  - Anterior margin is focally positive for invasive carcinoma.  - Perineural invasion.  - Lymphovascular invasion.  - See oncology table.   B. LYMPH NODE, RIGHT AXILLARY #1, SENTINEL, BIOPSY:  - One of one lymph nodes negative for carcinoma (0/1).   C. SKIN, RIGHT MASTECTOMY ADDITIONAL INFERIOR FLAP, EXCISION:  - Focal invasive lobular carcinoma, 2 mm, associated with lymphovascular  invasion.  - Margin is negative for carcinoma.    02/22/2020 Oncotype testing   RS 23  Distant risk of recurrence 9% Less than 1% benefit of chemotherapy   02/22/2020 Cancer Staging   Staging form: Breast, AJCC 8th Edition - Pathologic stage from 02/22/2020: Stage IA (pT2, pN0, cM0, G2, ER+, PR+, HER2-) - Signed by Pollyann Samples, NP on 06/09/2020   08/08/2020 Survivorship   SCP mailed to patient  08/08/2020      CURRENT THERAPY: Exemestane starting 01/22/20, changed to anastrozole 05/2021 due to insurance coverage  INTERVAL HISTORY Cynthia Shaw returns for follow up as scheduled. Last seen by me 10/06/22. 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  She has been doing well otherwise.  She fell chasing her grandchild and hurt the right knee, seeing Ortho.  She also developed some tenderness in the left outer breast but has been lifting heavy boxes decorating for the holiday.  Denies new lump/mass or nipple change.  Already anastrozole, hot flashes are improved.  Effexor is helpful for her mood.  ROS  All other systems reviewed and negative  Past Medical History:  Diagnosis Date   Anxiety    Cancer (HCC) 12/2019   right breast invasive lobular cancer   Depression    Family history of colon cancer    Family history of melanoma    Family history of throat cancer    Family history of uterine cancer    Hypertension      Past Surgical History:  Procedure Laterality Date   c sections      x3   CESAREAN SECTION     COLONOSCOPY WITH PROPOFOL N/A 01/18/2022   Procedure: COLONOSCOPY WITH PROPOFOL;  Surgeon: Corbin Ade, MD;  [facility];  Service: Endoscopy;  Laterality: N/A;  8:15 / ASA 2   KNEE ARTHROSCOPY Left    MASTECTOMY W/ SENTINEL NODE BIOPSY Right 02/22/2020  Procedure: RIGHT MASTECTOMY WITH SENTINEL LYMPH NODE BIOPSY;  Surgeon: Manus Rudd, MD;  Location: [facility];  Service: General;  Laterality: Right;  PEC BLOCK   POLYPECTOMY  01/18/2022   Procedure: POLYPECTOMY;  Surgeon: Corbin Ade, MD;  Location: [facility];  Service: Endoscopy;;   TONSILLECTOMY       Outpatient Encounter Medications as of 07/05/2023  Medication Sig   Cholecalciferol (VITAMIN D3) 125 MCG (5000 UT) CAPS Take 5,000 Units by mouth daily at 6 (six) AM.   cyclobenzaprine (FLEXERIL) 10 MG tablet Take 10 mg by mouth  at bedtime. As needed   ibuprofen (ADVIL) 800 MG tablet Take 800 mg by mouth daily as needed (fibromyalgia).   lisinopril (ZESTRIL) 20 MG tablet Take 20 mg by mouth daily.   Semaglutide (OZEMPIC, 2 MG/DOSE, Bath) Inject into the skin once a week.   [DISCONTINUED] anastrozole (ARIMIDEX) 1 MG tablet Take 1 tablet (1 mg total) by mouth daily.   [DISCONTINUED] venlafaxine XR (EFFEXOR-XR) 75 MG 24 hr capsule TAKE ONE CAPSULE BY MOUTH DAILY WITH BREAKFAST   anastrozole (ARIMIDEX) 1 MG tablet Take 1 tablet (1 mg total) by mouth daily.   Coenzyme Q10 (CO Q-10) 100 MG CAPS Take 100 mg by mouth daily.   venlafaxine XR (EFFEXOR-XR) 75 MG 24 hr capsule Take 1 capsule (75 mg total) by mouth daily with breakfast.   [DISCONTINUED] rosuvastatin (CRESTOR) 10 MG tablet Take 10 mg by mouth at bedtime.   No facility-administered encounter medications on file as of 07/05/2023.     Today's Vitals   07/05/23 0835 07/05/23 0836 07/05/23 0841  BP: (!) 146/95 120/83   Pulse: 92    Resp: 17    Temp: 98.2 F (36.8 C)    TempSrc: Temporal    SpO2: 98%    Weight: 239 lb 6.4 oz (108.6 kg)    PainSc:   4    Body mass index is 37.5 kg/m.   PHYSICAL EXAM GENERAL:alert, no distress and comfortable SKIN: no rash  EYES: sclera clear NECK: without mass LYMPH:  no palpable cervical or supraclavicular lymphadenopathy  LUNGS: clear with normal breathing effort HEART: regular rate & rhythm, no lower extremity edema ABDOMEN: abdomen soft, non-tender and normal bowel sounds NEURO: alert & oriented x 3 with fluent speech, no focal motor/sensory deficits Breast exam: S/p right mastectomy, incisions completely healed.  No palpable mass or nodularity at the chest wall, left breast, site of tenderness, or either axilla that I could appreciate.   CBC    Component Value Date/Time   WBC 8.1 07/05/2023 0804   RBC 4.38 07/05/2023 0804   HGB 15.2 (H) 07/05/2023 0804   HCT 44.9 07/05/2023 0804   PLT 271 07/05/2023 0804    MCV 102.5 (H) 07/05/2023 0804   MCH 34.7 (H) 07/05/2023 0804   MCHC 33.9 07/05/2023 0804   RDW 12.0 07/05/2023 0804   LYMPHSABS 2.4 07/05/2023 0804   MONOABS 0.5 07/05/2023 0804   EOSABS 0.1 07/05/2023 0804   BASOSABS 0.0 07/05/2023 0804     CMP     Component Value Date/Time   NA 139 07/05/2023 0804   K 4.0 07/05/2023 0804   CL 105 07/05/2023 0804   CO2 26 07/05/2023 0804   GLUCOSE 130 (H) 07/05/2023 0804   BUN 11 07/05/2023 0804   CREATININE 0.75 07/05/2023 0804   CALCIUM 9.8 07/05/2023 0804   PROT 6.8 07/05/2023 0804   ALBUMIN 4.3 07/05/2023 0804   AST 31 07/05/2023 0804  ALT 53 (H) 07/05/2023 0804   ALKPHOS 66 07/05/2023 0804   BILITOT 0.7 07/05/2023 0804   GFRNONAA >60 07/05/2023 0804   GFRAA >60 12/26/2019 1208     ASSESSMENT & PLAN:Cynthia Shaw is a 62 y.o. female with        1. 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 Given her multifocal disease, she underwent right mastectomy with Dr. Corliss Skains on 02/22/20. Her Oncotype RS was 23. Due to the low risk disease, adjuvant chemotherapy was not recommended.  She did not require post mastectomy radiation. -She started Exemestane before her breast surgery on 01/22/20, insurance switched her to Anastrozole in 04/2021 which she tolerates much better. Plan to continue for total of 7-10 years given her lobular carcinoma histology.  -Screening breast MRI 05/26/2021 was negative, repeat q1-2 years; -Screening left mammogram 01/21/23 was benign -Cynthia Shaw is clinically doing well.  Tolerating anastrozole, exam is benign, labs are unremarkable.  The left breast pain is likely a muscle strain from heavy lifting and she will monitor closely.  Overall no clinical concern for recurrence -Continue breast cancer surveillance and AI -F/up in 6 months, or sooner if needed   2. H/o Cervical  Cancer, On situ carcinoma, Genetic Testing (negative) -Dx 1990 and treated at [facility] in [place], [place]. Pap smears have been benign since.  -She has family history of Endometrial cancer, ADH, and colon cancer. -She has negative genetic testing: No pathogenic variants detected on the [facility] Breast Cancer STAT panel. The report date is 01/02/2020.   3. Anxiety/Depression, Hot flashes -She had been anxious and depressed about her cancer diagnosis and recent weight gain.  -She started Effexor for mood and hot flashes from AI, this was very effective however she weaned off and started Cymbalta for fibromyalgia -Cymbalta was not effective and she is back on Effexor, tolerable and helpful    4. Arthritis, Weight gain, Fibromyalgia -She has chronic left hip and back pain since 2019. This resolved after shots to area and weight loss.  -Intentional weight loss  -Fell on R knee recently, following up with ortho   5. Smoking Cessation and health maintenance: - She was able to quit smoking in 02/2020.  -Last Pap 2 years ago, will update at upcoming annual wellness physical -Colonoscopy 01/18/2022 with Dr. Jena Gauss, 3 polyps removed (TA's); 5 year recall  -she also had derm screening last year, no biopsies   6. Bone Health, Vit D Deficiency  -She was on high dose vit D in the past, currently taking 2000 IU daily. Will repeat at PCP this month -Per pt her last DEXA was normal, continue per PCP         PLAN: -Labs reviewed -Continue breast cancer surveillance and AI -Effexor and Anastrozole refilled -F/up in 6 months, or sooner if needed.     All questions were answered. The patient knows to call the clinic with any problems, questions or concerns. No barriers to learning were detected.   Santiago Glad, NP-C 07/05/2023

## 2023-07-05 ENCOUNTER — Encounter: Payer: Self-pay | Admitting: Nurse Practitioner

## 2023-07-05 ENCOUNTER — Inpatient Hospital Stay: Payer: BC Managed Care – PPO | Attending: Neurology

## 2023-07-05 ENCOUNTER — Inpatient Hospital Stay (HOSPITAL_BASED_OUTPATIENT_CLINIC_OR_DEPARTMENT_OTHER): Payer: BC Managed Care – PPO | Admitting: Nurse Practitioner

## 2023-07-05 VITALS — BP 120/83 | HR 92 | Temp 98.2°F | Resp 17 | Wt 239.4 lb

## 2023-07-05 DIAGNOSIS — R232 Flushing: Secondary | ICD-10-CM | POA: Diagnosis not present

## 2023-07-05 DIAGNOSIS — E559 Vitamin D deficiency, unspecified: Secondary | ICD-10-CM | POA: Insufficient documentation

## 2023-07-05 DIAGNOSIS — Z170 Estrogen receptor positive status [ER+]: Secondary | ICD-10-CM

## 2023-07-05 DIAGNOSIS — Z1732 Human epidermal growth factor receptor 2 negative status: Secondary | ICD-10-CM | POA: Insufficient documentation

## 2023-07-05 DIAGNOSIS — Z808 Family history of malignant neoplasm of other organs or systems: Secondary | ICD-10-CM | POA: Insufficient documentation

## 2023-07-05 DIAGNOSIS — C50411 Malignant neoplasm of upper-outer quadrant of right female breast: Secondary | ICD-10-CM | POA: Diagnosis present

## 2023-07-05 DIAGNOSIS — Z9011 Acquired absence of right breast and nipple: Secondary | ICD-10-CM | POA: Insufficient documentation

## 2023-07-05 DIAGNOSIS — Z800 Family history of malignant neoplasm of digestive organs: Secondary | ICD-10-CM | POA: Insufficient documentation

## 2023-07-05 DIAGNOSIS — Z8049 Family history of malignant neoplasm of other genital organs: Secondary | ICD-10-CM | POA: Insufficient documentation

## 2023-07-05 DIAGNOSIS — Z79811 Long term (current) use of aromatase inhibitors: Secondary | ICD-10-CM | POA: Diagnosis not present

## 2023-07-05 DIAGNOSIS — M797 Fibromyalgia: Secondary | ICD-10-CM | POA: Insufficient documentation

## 2023-07-05 DIAGNOSIS — M1990 Unspecified osteoarthritis, unspecified site: Secondary | ICD-10-CM | POA: Diagnosis not present

## 2023-07-05 DIAGNOSIS — Z1721 Progesterone receptor positive status: Secondary | ICD-10-CM | POA: Diagnosis not present

## 2023-07-05 DIAGNOSIS — Z87891 Personal history of nicotine dependence: Secondary | ICD-10-CM | POA: Diagnosis not present

## 2023-07-05 DIAGNOSIS — F418 Other specified anxiety disorders: Secondary | ICD-10-CM | POA: Diagnosis not present

## 2023-07-05 LAB — CMP (CANCER CENTER ONLY)
ALT: 53 U/L — ABNORMAL HIGH (ref 0–44)
AST: 31 U/L (ref 15–41)
Albumin: 4.3 g/dL (ref 3.5–5.0)
Alkaline Phosphatase: 66 U/L (ref 38–126)
Anion gap: 8 (ref 5–15)
BUN: 11 mg/dL (ref 8–23)
CO2: 26 mmol/L (ref 22–32)
Calcium: 9.8 mg/dL (ref 8.9–10.3)
Chloride: 105 mmol/L (ref 98–111)
Creatinine: 0.75 mg/dL (ref 0.44–1.00)
GFR, Estimated: >60 mL/min (ref >60)
Glucose, Bld: 130 mg/dL — ABNORMAL HIGH (ref 70–99)
Potassium: 4 mmol/L (ref 3.5–5.1)
Sodium: 139 mmol/L (ref 135–145)
Total Bilirubin: 0.7 mg/dL (ref <1.2)
Total Protein: 6.8 g/dL (ref 6.5–8.1)

## 2023-07-05 LAB — CBC WITH DIFFERENTIAL (CANCER CENTER ONLY)
Abs Immature Granulocytes: 0.02 10*3/uL (ref 0.00–0.07)
Basophils Absolute: 0 10*3/uL (ref 0.0–0.1)
Basophils Relative: 0 %
Eosinophils Absolute: 0.1 10*3/uL (ref 0.0–0.5)
Eosinophils Relative: 2 %
HCT: 44.9 % (ref 36.0–46.0)
Hemoglobin: 15.2 g/dL — ABNORMAL HIGH (ref 12.0–15.0)
Immature Granulocytes: 0 %
Lymphocytes Relative: 30 %
Lymphs Abs: 2.4 10*3/uL (ref 0.7–4.0)
MCH: 34.7 pg — ABNORMAL HIGH (ref 26.0–34.0)
MCHC: 33.9 g/dL (ref 30.0–36.0)
MCV: 102.5 fL — ABNORMAL HIGH (ref 80.0–100.0)
Monocytes Absolute: 0.5 10*3/uL (ref 0.1–1.0)
Monocytes Relative: 6 %
Neutro Abs: 5 10*3/uL (ref 1.7–7.7)
Neutrophils Relative %: 62 %
Platelet Count: 271 10*3/uL (ref 150–400)
RBC: 4.38 MIL/uL (ref 3.87–5.11)
RDW: 12 % (ref 11.5–15.5)
WBC Count: 8.1 10*3/uL (ref 4.0–10.5)
nRBC: 0 % (ref 0.0–0.2)

## 2023-07-05 MED ORDER — VENLAFAXINE HCL ER 75 MG PO CP24: 75.0000 mg | ORAL_CAPSULE | Freq: Every day | ORAL | 3 refills | Status: DC

## 2023-07-05 MED ORDER — ANASTROZOLE 1 MG PO TABS: 1.0000 mg | ORAL_TABLET | Freq: Every day | ORAL | 3 refills | Status: DC

## 2024-01-12 ENCOUNTER — Other Ambulatory Visit: Payer: Self-pay | Admitting: Nurse Practitioner

## 2024-01-12 DIAGNOSIS — Z1231 Encounter for screening mammogram for malignant neoplasm of breast: Secondary | ICD-10-CM

## 2024-01-26 ENCOUNTER — Ambulatory Visit

## 2024-01-30 NOTE — Progress Notes (Deleted)
 [facility]   [phone] [phone]    Patient Care Team: [name], MD as PCP - General (Neurology) [name], RN as Oncology Nurse Navigator Glean, Stephane BROCKS, RN (Inactive) as Oncology Nurse Navigator Belinda Cough, MD as Consulting Physician (General Surgery) Lanny Callander, MD as Consulting Physician (Hematology) Dewey Rush, MD as Consulting Physician (Radiation Oncology) Carl Butner K, NP as Nurse Practitioner (Nurse Practitioner)   CHIEF COMPLAINT: Follow up right breast cancer   Oncology History Overview Note  Cancer Staging Malignant neoplasm of upper-outer quadrant of right breast in female, estrogen receptor positive (HCC) Staging form: Breast, AJCC 8th Edition - Clinical stage from 12/26/2019: Stage IB (cT2, cN0, cM0, G2, ER+, PR+, HER2-) - Unsigned - Pathologic stage from 02/22/2020: Stage IA (pT2, pN0, cM0, G2, ER+, PR+, HER2-) - Signed by Dandre Sisler K, NP on 06/09/2020    Malignant neoplasm of upper-outer quadrant of right breast in female, estrogen receptor positive (HCC)  12/20/2019 Initial Biopsy   Diagnosis 1. Breast, right, needle core biopsy, 7:30 o'clock, 4 cmfn, ribbon clip - FIBROCYSTIC CHANGES. - THERE IS NO EVIDENCE OF MALIGNANCY. - SEE COMMENT. 2. Breast, right, needle core biopsy, 9 o'clock, 5 cmfn, coil clip - INVASIVE MAMMARY CARCINOMA. - SEE COMMENT. 3. Breast, right, needle core biopsy, outer, x clip - INVASIVE MAMMARY CARCINOMA. - ATYPICAL LOBULAR HYPERPLASIA WITH CALCIFICATIONS. - SEE COMMENT. Microscopic Comment 2. The carcinoma appears at least grade 2. The longest span of tumor is 1.2 cm. An E-Cadherin and a breast prognostic profile will be performed on part 2 and the results reported separately. Dr. Jodene has reviewed part 2 and concurs with this interpretation. The results were called to [facility] on 12/21/2019. 3. The carcinoma in part 3 is morphologically  dissimilar from that in part 2. The longest span of tumor is 0.1 cm. An E-Cadherin and a breast prognostic profile will also be performed on part 3 and the results reported separately. (JBK:ah 12/21/19)   12/20/2019 Receptors her2   2. PROGNOSTIC INDICATORS Results: IMMUNOHISTOCHEMICAL AND MORPHOMETRIC ANALYSIS PERFORMED MANUALLY The tumor cells are NEGATIVE for Her2 (1+). Estrogen Receptor: 95%, POSITIVE, STRONG STAINING INTENSITY Progesterone Receptor: 95%, POSITIVE, STRONG STAINING INTENSITY Proliferation Marker Ki67: 15%   3. Prognostic indicators:  HER2 NEGATIVE  ER: 95% POSITIVE STRONG STAINING PR 90% POSITIVE STRONG STAINING  Proliferation Marker Ki67: 20%   12/20/2019 Mammogram   Diagnostic Mammogram  IMPRESSION    12/25/2019 Initial Diagnosis   Malignant neoplasm of upper-outer quadrant of right breast in female, estrogen receptor positive (HCC)   12/29/2019 Breast MRI   IMPRESSION: 1. 2.5 cm spiculated mass in the upper outer right breast at middle depth consistent with the patient's biopsy-proven malignancy (coil clip). 2. Additional 9 mm enhancing mass located approximately 3 cm inferior to the index lesion. This is seen adjacent to a biopsy tract in the central right breast and may represents the additional biopsied focus of malignancy. The associated X-shaped clip was displaced 4 cm medially at the time of biopsy. 3. No MRI evidence of malignancy on the left. 4. No suspicious lymphadenopathy.   01/02/2020 Genetic Testing   Negative genetic testing:  No pathogenic variants detected on the [facility] Breast Cancer STAT panel or the Common Hereditary Cancers panel. The report dates are 01/02/2020 and 01/08/2020, respectively.  The Breast Cancer STAT Panel offered by [facility] includes sequencing and deletion/duplication analysis for the following 9 genes:  ATM, BRCA1, BRCA2,  CDH1, CHEK2, PALB2, PTEN, STK11 and TP53. The Common Hereditary Cancers Panel offered by [facility] includes  sequencing and/or deletion duplication testing of the following 47 genes: APC, ATM, AXIN2, BARD1, BMPR1A, BRCA1, BRCA2, BRIP1, CDH1, CDK4, CDKN2A (p14ARF), CDKN2A (p16INK4a), CHEK2, CTNNA1, DICER1, EPCAM (Deletion/duplication testing only), GREM1 (promoter region deletion/duplication testing only), KIT, MEN1, MLH1, MSH2, MSH3, MSH6, MUTYH, NBN, NF1, NTHL1, PALB2, PDGFRA, PMS2, POLD1, POLE, PTEN, RAD50, RAD51C, RAD51D, SDHB, SDHC, SDHD, SMAD4, SMARCA4. STK11, TP53, TSC1, TSC2, and VHL.  The following genes were evaluated for sequence changes only: SDHA and HOXB13 c.251G>A variant only.   01/16/2020 Pathology Results   Diagnosis Breast, right, needle core biopsy, mid to post 1/3 out mid below known cancer - INVASIVE AND IN SITU LOBULAR CARCINOMA. - SEE MICROSCOPIC DESCRIPTION. Microscopic Comment The greatest dimension in a single core is 0.8 cm.   01/22/2020 -  Neo-Adjuvant Anti-estrogen oral therapy   Exemestane  25mg  once daily starting in 01/22/20   02/22/2020 Surgery   RIGHT MASTECTOMY WITH SENTINEL LYMPH NODE BIOPSY   02/22/2020 Pathology Results   FINAL MICROSCOPIC DIAGNOSIS:   A. BREAST, RIGHT, MASTECTOMY:  - Invasive lobular carcinoma, grade 2, spanning 4.1 cm.  - Lobular carcinoma in situ.  - Anterior margin is focally positive for invasive carcinoma.  - Perineural invasion.  - Lymphovascular invasion.  - See oncology table.   B. LYMPH NODE, RIGHT AXILLARY #1, SENTINEL, BIOPSY:  - One of one lymph nodes negative for carcinoma (0/1).   C. SKIN, RIGHT MASTECTOMY ADDITIONAL INFERIOR FLAP, EXCISION:  - Focal invasive lobular carcinoma, 2 mm, associated with lymphovascular  invasion.  - Margin is negative for carcinoma.    02/22/2020 Oncotype testing   RS 23  Distant risk of recurrence 9% Less than 1% benefit of chemotherapy   02/22/2020 Cancer Staging   Staging form: Breast, AJCC 8th Edition - Pathologic stage from 02/22/2020: Stage IA (pT2, pN0, cM0, G2, ER+, PR+, HER2-) -  Signed by Kealy Lewter K, NP on 06/09/2020   08/08/2020 Survivorship   SCP mailed to patient 08/08/2020      CURRENT THERAPY: Exemestane  starting 01/22/20, changed to anastrozole  05/2021 due to insurance coverage   INTERVAL HISTORY Ms. Drinkard returns for follow up as scheduled. Last seen by me 07/05/23  ROS   Past Medical History:  Diagnosis Date   Anxiety    Cancer (HCC) 12/2019   right breast invasive lobular cancer   Depression    Family history of colon cancer    Family history of melanoma    Family history of throat cancer    Family history of uterine cancer    Hypertension      Past Surgical History:  Procedure Laterality Date   c sections      x3   CESAREAN SECTION     COLONOSCOPY WITH PROPOFOL  N/A 01/18/2022   Procedure: COLONOSCOPY WITH PROPOFOL ;  Surgeon: Shaaron Lamar HERO, MD;  Location: [facility];  Service: Endoscopy;  Laterality: N/A;  8:15 / ASA 2   KNEE ARTHROSCOPY Left    MASTECTOMY W/ SENTINEL NODE BIOPSY Right 02/22/2020   Procedure: RIGHT MASTECTOMY WITH SENTINEL LYMPH NODE BIOPSY;  Surgeon: Belinda Cough, MD;  Location: [facility];  Service: General;  Laterality: Right;  PEC BLOCK   POLYPECTOMY  01/18/2022   Procedure: POLYPECTOMY;  Surgeon: Shaaron Lamar HERO, MD;  Location: [facility];  Service: Endoscopy;;   TONSILLECTOMY       Outpatient Encounter Medications as of 01/31/2024  Medication Sig  anastrozole  (ARIMIDEX ) 1 MG tablet Take 1 tablet (1 mg total) by mouth daily.   Cholecalciferol (VITAMIN D3) 125 MCG (5000 UT) CAPS Take 5,000 Units by mouth daily at 6 (six) AM.   cyclobenzaprine (FLEXERIL) 10 MG tablet Take 10 mg by mouth at bedtime. As needed   ibuprofen  (ADVIL ) 800 MG tablet Take 800 mg by mouth daily as needed (fibromyalgia).   lisinopril  (ZESTRIL ) 20 MG tablet Take 20 mg by mouth daily.   Semaglutide (OZEMPIC, 2 MG/DOSE, [place]) Inject into the skin once a week.   venlafaxine  XR (EFFEXOR -XR) 75 MG 24 hr capsule Take 1  capsule (75 mg total) by mouth daily with breakfast.   No facility-administered encounter medications on file as of 01/31/2024.     There were no vitals filed for this visit. There is no height or weight on file to calculate BMI.   ECOG PERFORMANCE STATUS: {CHL ONC ECOG PS:[id number]}  PHYSICAL EXAM GENERAL:alert, no distress and comfortable SKIN: no rash  EYES: sclera clear NECK: without mass LYMPH:  no palpable cervical or supraclavicular lymphadenopathy  LUNGS: clear with normal breathing effort HEART: regular rate & rhythm, no lower extremity edema ABDOMEN: abdomen soft, non-tender and normal bowel sounds NEURO: alert & oriented x 3 with fluent speech, no focal motor/sensory deficits Breast exam:  PAC without erythema    CBC    Latest Ref Rng & Units 07/05/2023    8:04 AM 10/06/2022    9:27 AM 12/14/2021    9:11 AM  CBC  WBC 4.0 - 10.5 K/uL 8.1  7.1  7.5   Hemoglobin 12.0 - 15.0 g/dL 84.7  85.2  85.6   Hematocrit 36.0 - 46.0 % 44.9  42.1  42.4   Platelets 150 - 400 K/uL 271  236  254       CMP     Latest Ref Rng & Units 07/05/2023    8:04 AM 10/06/2022    9:27 AM 12/14/2021    9:11 AM  CMP  Glucose 70 - 99 mg/dL 869  889  94   BUN 8 - 23 mg/dL 11  19  7    Creatinine 0.44 - 1.00 mg/dL 9.24  9.35  9.27   Sodium 135 - 145 mmol/L 139  139  142   Potassium 3.5 - 5.1 mmol/L 4.0  4.3  4.4   Chloride 98 - 111 mmol/L 105  106  108   CO2 22 - 32 mmol/L 26  26  30    Calcium 8.9 - 10.3 mg/dL 9.8  9.7  89.7   Total Protein 6.5 - 8.1 g/dL 6.8  6.9  7.2   Total Bilirubin <1.2 mg/dL 0.7  0.6  0.5   Alkaline Phos 38 - 126 U/L 66  65  78   AST 15 - 41 U/L 31  28  14    ALT 0 - 44 U/L 53  41  19       ASSESSMENT & PLAN:Anapaula Mosso is a 63 y.o. female with        1. 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 Given her  multifocal disease, she underwent right mastectomy with Dr. Belinda on 02/22/20. Her Oncotype RS was 23. Due to the low risk disease, adjuvant chemotherapy was not recommended.  She did not require post mastectomy radiation. -She started Exemestane  before her breast surgery on 01/22/20, insurance switched her to  Anastrozole  in 04/2021 which she tolerates much better. Plan to continue for total of 7-10 years given her lobular carcinoma histology.  -Screening breast MRI 05/26/2021 was negative, repeat q1-2 years;    2. H/o Cervical Cancer, On situ carcinoma, Genetic Testing (negative) -Dx 1990 and treated at [facility] in [place], [place]. Pap smears have been benign since.  -She has family history of Endometrial cancer, ADH, and colon cancer. -She has negative genetic testing: No pathogenic variants detected on the [facility] Breast Cancer STAT panel. The report date is 01/02/2020.   3. Anxiety/Depression, Hot flashes -She had been anxious and depressed about her cancer diagnosis and recent weight gain.  -She started Effexor  for mood and hot flashes from [facility], this was very effective however she weaned off and started Cymbalta  for fibromyalgia -Cymbalta  was not effective and she is back on Effexor , tolerable and helpful    4. Arthritis, Weight gain, Fibromyalgia -She has chronic left hip and back pain since 2019. This resolved after shots to area and weight loss.  -Intentional weight loss  -Fell on R knee recently, following up with ortho   5. Smoking Cessation and health maintenance: - She was able to quit smoking in 02/2020.  -Last Pap 2 years ago, will update at upcoming annual wellness physical -Colonoscopy 01/18/2022 with Dr. Shaaron, 3 polyps removed (TA's); 5 year recall  -she also had derm screening last year, no biopsies   6. Bone Health, Vit D Deficiency  -She was on high dose vit D in the past, currently taking 2000 IU daily. Will repeat at PCP this month -Per pt her last DEXA was  normal, continue per PCP      PLAN:  No orders of the defined types were placed in this encounter.     All questions were answered. The patient knows to call the clinic with any problems, questions or concerns. No barriers to learning were detected. I spent *** counseling the patient face to face. The total time spent in the appointment was *** and more than 50% was on counseling, review of test results, and coordination of care.   Taletha Twiford K Kodi Steil, NP 01/30/2024 8:18 PM

## 2024-01-31 ENCOUNTER — Other Ambulatory Visit: Payer: Self-pay

## 2024-01-31 ENCOUNTER — Inpatient Hospital Stay: Payer: BC Managed Care – PPO | Attending: Neurology

## 2024-01-31 ENCOUNTER — Inpatient Hospital Stay: Payer: BC Managed Care – PPO | Admitting: Nurse Practitioner

## 2024-01-31 NOTE — Progress Notes (Signed)
 Called patient due to not showing up for her 830 app with Lacie Burton NP. Was unable to speak to anyone left a message on her VM that we will reschedule her app. Will no show patients for app today.

## 2024-02-03 ENCOUNTER — Ambulatory Visit

## 2024-02-17 ENCOUNTER — Ambulatory Visit
Admission: RE | Admit: 2024-02-17 | Discharge: 2024-02-17 | Disposition: A | Discharge status: Home / Self Care | Source: Ambulatory Visit | Attending: Nurse Practitioner | Admitting: Nurse Practitioner

## 2024-02-17 DIAGNOSIS — Z1231 Encounter for screening mammogram for malignant neoplasm of breast: Secondary | ICD-10-CM

## 2024-02-17 HISTORY — DX: Malignant neoplasm of unspecified site of unspecified female breast: C50.919

## 2024-02-22 ENCOUNTER — Ambulatory Visit: Payer: Self-pay | Admitting: Nurse Practitioner

## 2024-05-11 ENCOUNTER — Other Ambulatory Visit: Payer: Self-pay

## 2024-05-11 DIAGNOSIS — C50411 Malignant neoplasm of upper-outer quadrant of right female breast: Secondary | ICD-10-CM

## 2024-05-14 ENCOUNTER — Inpatient Hospital Stay: Admitting: Nurse Practitioner

## 2024-05-14 ENCOUNTER — Encounter: Payer: Self-pay | Admitting: Nurse Practitioner

## 2024-05-14 ENCOUNTER — Inpatient Hospital Stay: Attending: Neurology

## 2024-05-14 VITALS — BP 160/100 | HR 99 | Temp 97.6°F | Resp 17 | Wt 244.8 lb

## 2024-05-14 DIAGNOSIS — Z87891 Personal history of nicotine dependence: Secondary | ICD-10-CM | POA: Diagnosis not present

## 2024-05-14 DIAGNOSIS — Z170 Estrogen receptor positive status [ER+]: Secondary | ICD-10-CM | POA: Diagnosis not present

## 2024-05-14 DIAGNOSIS — Z9011 Acquired absence of right breast and nipple: Secondary | ICD-10-CM | POA: Insufficient documentation

## 2024-05-14 DIAGNOSIS — Z800 Family history of malignant neoplasm of digestive organs: Secondary | ICD-10-CM | POA: Diagnosis not present

## 2024-05-14 DIAGNOSIS — R232 Flushing: Secondary | ICD-10-CM | POA: Insufficient documentation

## 2024-05-14 DIAGNOSIS — C50411 Malignant neoplasm of upper-outer quadrant of right female breast: Secondary | ICD-10-CM

## 2024-05-14 DIAGNOSIS — Z1732 Human epidermal growth factor receptor 2 negative status: Secondary | ICD-10-CM | POA: Diagnosis not present

## 2024-05-14 DIAGNOSIS — Z808 Family history of malignant neoplasm of other organs or systems: Secondary | ICD-10-CM | POA: Diagnosis not present

## 2024-05-14 DIAGNOSIS — M797 Fibromyalgia: Secondary | ICD-10-CM | POA: Diagnosis not present

## 2024-05-14 DIAGNOSIS — Z8049 Family history of malignant neoplasm of other genital organs: Secondary | ICD-10-CM | POA: Diagnosis not present

## 2024-05-14 DIAGNOSIS — Z8541 Personal history of malignant neoplasm of cervix uteri: Secondary | ICD-10-CM | POA: Insufficient documentation

## 2024-05-14 DIAGNOSIS — E559 Vitamin D deficiency, unspecified: Secondary | ICD-10-CM | POA: Diagnosis not present

## 2024-05-14 DIAGNOSIS — M1990 Unspecified osteoarthritis, unspecified site: Secondary | ICD-10-CM | POA: Diagnosis not present

## 2024-05-14 DIAGNOSIS — Z1721 Progesterone receptor positive status: Secondary | ICD-10-CM | POA: Diagnosis not present

## 2024-05-14 DIAGNOSIS — R7989 Other specified abnormal findings of blood chemistry: Secondary | ICD-10-CM | POA: Diagnosis not present

## 2024-05-14 DIAGNOSIS — Z79811 Long term (current) use of aromatase inhibitors: Secondary | ICD-10-CM | POA: Insufficient documentation

## 2024-05-14 DIAGNOSIS — F418 Other specified anxiety disorders: Secondary | ICD-10-CM | POA: Insufficient documentation

## 2024-05-14 LAB — CBC WITH DIFFERENTIAL (CANCER CENTER ONLY)
Abs Immature Granulocytes: 0.02 K/uL (ref 0.00–0.07)
Basophils Absolute: 0.1 K/uL (ref 0.0–0.1)
Basophils Relative: 1 %
Eosinophils Absolute: 0.2 K/uL (ref 0.0–0.5)
Eosinophils Relative: 2 %
HCT: 41.5 % (ref 36.0–46.0)
Hemoglobin: 14.2 g/dL (ref 12.0–15.0)
Immature Granulocytes: 0 %
Lymphocytes Relative: 32 %
Lymphs Abs: 2.4 K/uL (ref 0.7–4.0)
MCH: 35.3 pg — ABNORMAL HIGH (ref 26.0–34.0)
MCHC: 34.2 g/dL (ref 30.0–36.0)
MCV: 103.2 fL — ABNORMAL HIGH (ref 80.0–100.0)
Monocytes Absolute: 0.5 K/uL (ref 0.1–1.0)
Monocytes Relative: 7 %
Neutro Abs: 4.4 K/uL (ref 1.7–7.7)
Neutrophils Relative %: 58 %
Platelet Count: 191 K/uL (ref 150–400)
RBC: 4.02 MIL/uL (ref 3.87–5.11)
RDW: 12 % (ref 11.5–15.5)
WBC Count: 7.6 K/uL (ref 4.0–10.5)
nRBC: 0 % (ref 0.0–0.2)

## 2024-05-14 LAB — CMP (CANCER CENTER ONLY)
ALT: 76 U/L — ABNORMAL HIGH (ref 0–44)
AST: 55 U/L — ABNORMAL HIGH (ref 15–41)
Albumin: 4.5 g/dL (ref 3.5–5.0)
Alkaline Phosphatase: 66 U/L (ref 38–126)
Anion gap: 8 (ref 5–15)
BUN: 17 mg/dL (ref 8–23)
CO2: 27 mmol/L (ref 22–32)
Calcium: 10.3 mg/dL (ref 8.9–10.3)
Chloride: 102 mmol/L (ref 98–111)
Creatinine: 0.83 mg/dL (ref 0.44–1.00)
GFR, Estimated: >60 mL/min (ref >60)
Glucose, Bld: 106 mg/dL — ABNORMAL HIGH (ref 70–99)
Potassium: 4.3 mmol/L (ref 3.5–5.1)
Sodium: 137 mmol/L (ref 135–145)
Total Bilirubin: 0.6 mg/dL (ref 0.0–1.2)
Total Protein: 6.9 g/dL (ref 6.5–8.1)

## 2024-05-14 MED ORDER — ANASTROZOLE 1 MG PO TABS: 1.0000 mg | ORAL_TABLET | Freq: Every day | ORAL | 3 refills | Status: AC

## 2024-05-14 NOTE — Progress Notes (Signed)
 [facility]   [phone] [phone]    Patient Care Team: [name], MD as PCP - General (Neurology) [name], RN as Oncology Nurse Navigator Belinda Cough, MD as Consulting Physician (General Surgery) Lanny Callander, MD as Consulting Physician (Hematology) Dewey Rush, MD as Consulting Physician (Radiation Oncology) [name], NP as Nurse Practitioner (Nurse Practitioner)   CHIEF COMPLAINT: Follow-up right breast cancer  Oncology History Overview Note  Cancer Staging Malignant neoplasm of upper-outer quadrant of right breast in female, estrogen receptor positive (HCC) Staging form: Breast, AJCC 8th Edition - Clinical stage from 12/26/2019: Stage IB (cT2, cN0, cM0, G2, ER+, PR+, HER2-) - Unsigned - Pathologic stage from 02/22/2020: Stage IA (pT2, pN0, cM0, G2, ER+, PR+, HER2-) - Signed by Christie Copley K, NP on 06/09/2020    Malignant neoplasm of upper-outer quadrant of right breast in female, estrogen receptor positive (HCC)  12/20/2019 Initial Biopsy   Diagnosis 1. Breast, right, needle core biopsy, 7:30 o'clock, 4 cmfn, ribbon clip - FIBROCYSTIC CHANGES. - THERE IS NO EVIDENCE OF MALIGNANCY. - SEE COMMENT. 2. Breast, right, needle core biopsy, 9 o'clock, 5 cmfn, coil clip - INVASIVE MAMMARY CARCINOMA. - SEE COMMENT. 3. Breast, right, needle core biopsy, outer, x clip - INVASIVE MAMMARY CARCINOMA. - ATYPICAL LOBULAR HYPERPLASIA WITH CALCIFICATIONS. - SEE COMMENT. Microscopic Comment 2. The carcinoma appears at least grade 2. The longest span of tumor is 1.2 cm. An E-Cadherin and a breast prognostic profile will be performed on part 2 and the results reported separately. Dr. Jodene has reviewed part 2 and concurs with this interpretation. The results were called to [facility] on 12/21/2019. 3. The carcinoma in part 3 is morphologically dissimilar from that in part 2. The longest span of tumor is 0.1  cm. An E-Cadherin and a breast prognostic profile will also be performed on part 3 and the results reported separately. (JBK:ah 12/21/19)   12/20/2019 Receptors her2   2. PROGNOSTIC INDICATORS Results: IMMUNOHISTOCHEMICAL AND MORPHOMETRIC ANALYSIS PERFORMED MANUALLY The tumor cells are NEGATIVE for Her2 (1+). Estrogen Receptor: 95%, POSITIVE, STRONG STAINING INTENSITY Progesterone Receptor: 95%, POSITIVE, STRONG STAINING INTENSITY Proliferation Marker Ki67: 15%   3. Prognostic indicators:  HER2 NEGATIVE  ER: 95% POSITIVE STRONG STAINING PR 90% POSITIVE STRONG STAINING  Proliferation Marker Ki67: 20%   12/20/2019 Mammogram   Diagnostic Mammogram  IMPRESSION    12/25/2019 Initial Diagnosis   Malignant neoplasm of upper-outer quadrant of right breast in female, estrogen receptor positive (HCC)   12/29/2019 Breast MRI   IMPRESSION: 1. 2.5 cm spiculated mass in the upper outer right breast at middle depth consistent with the patient's biopsy-proven malignancy (coil clip). 2. Additional 9 mm enhancing mass located approximately 3 cm inferior to the index lesion. This is seen adjacent to a biopsy tract in the central right breast and may represents the additional biopsied focus of malignancy. The associated X-shaped clip was displaced 4 cm medially at the time of biopsy. 3. No MRI evidence of malignancy on the left. 4. No suspicious lymphadenopathy.   01/02/2020 Genetic Testing   Negative genetic testing:  No pathogenic variants detected on the [facility] Breast Cancer STAT panel or the Common Hereditary Cancers panel. The report dates are 01/02/2020 and 01/08/2020, respectively.  The Breast Cancer STAT Panel offered by [facility] includes sequencing and deletion/duplication analysis for the following 9 genes:  ATM, BRCA1, BRCA2, CDH1, CHEK2, PALB2, PTEN, STK11 and TP53. The Common Hereditary Cancers  Panel offered by [facility] includes sequencing and/or deletion duplication testing of the following  47 genes: APC, ATM, AXIN2, BARD1, BMPR1A, BRCA1, BRCA2, BRIP1, CDH1, CDK4, CDKN2A (p14ARF), CDKN2A (p16INK4a), CHEK2, CTNNA1, DICER1, EPCAM (Deletion/duplication testing only), GREM1 (promoter region deletion/duplication testing only), KIT, MEN1, MLH1, MSH2, MSH3, MSH6, MUTYH, NBN, NF1, NTHL1, PALB2, PDGFRA, PMS2, POLD1, POLE, PTEN, RAD50, RAD51C, RAD51D, SDHB, SDHC, SDHD, SMAD4, SMARCA4. STK11, TP53, TSC1, TSC2, and VHL.  The following genes were evaluated for sequence changes only: SDHA and HOXB13 c.251G>A variant only.   01/16/2020 Pathology Results   Diagnosis Breast, right, needle core biopsy, mid to post 1/3 out mid below known cancer - INVASIVE AND IN SITU LOBULAR CARCINOMA. - SEE MICROSCOPIC DESCRIPTION. Microscopic Comment The greatest dimension in a single core is 0.8 cm.   01/22/2020 -  Neo-Adjuvant Anti-estrogen oral therapy   Exemestane  25mg  once daily starting in 01/22/20   02/22/2020 Surgery   RIGHT MASTECTOMY WITH SENTINEL LYMPH NODE BIOPSY   02/22/2020 Pathology Results   FINAL MICROSCOPIC DIAGNOSIS:   A. BREAST, RIGHT, MASTECTOMY:  - Invasive lobular carcinoma, grade 2, spanning 4.1 cm.  - Lobular carcinoma in situ.  - Anterior margin is focally positive for invasive carcinoma.  - Perineural invasion.  - Lymphovascular invasion.  - See oncology table.   B. LYMPH NODE, RIGHT AXILLARY #1, SENTINEL, BIOPSY:  - One of one lymph nodes negative for carcinoma (0/1).   C. SKIN, RIGHT MASTECTOMY ADDITIONAL INFERIOR FLAP, EXCISION:  - Focal invasive lobular carcinoma, 2 mm, associated with lymphovascular  invasion.  - Margin is negative for carcinoma.    02/22/2020 Oncotype testing   RS 23  Distant risk of recurrence 9% Less than 1% benefit of chemotherapy   02/22/2020 Cancer Staging   Staging form: Breast, AJCC 8th Edition - Pathologic stage from 02/22/2020: Stage IA (pT2, pN0, cM0, G2, ER+, PR+, HER2-) - Signed by Jimma Ortman K, NP on 06/09/2020   08/08/2020  Survivorship   SCP mailed to patient 08/08/2020      CURRENT THERAPY: Exemestane  starting 01/22/20, changed to anastrozole  05/2021 due to insurance coverage   INTERVAL HISTORY Cynthia Shaw returns for follow-up as scheduled, last seen by me 07/05/2023.  Had to reschedule her visit due to some family stress including her husband taking a new job with odd hours and moving in with her mother whose health is declining.  Doing well herself, denies breast concerns such as new lump/mass, nipple discharge or inversion, or skin change.  Tolerating anastrozole  without side effects.  PCP increased Effexor  which is helping better.  Denies other change in medication, RUQ pain, alcohol use, or any other new or specific complaints.  ROS  All other systems reviewed and negative  Past Medical History:  Diagnosis Date   Anxiety    Breast cancer (HCC)    Cancer (HCC) 12/2019   right breast invasive lobular cancer   Depression    Family history of colon cancer    Family history of melanoma    Family history of throat cancer    Family history of uterine cancer    Hypertension      Past Surgical History:  Procedure Laterality Date   c sections      x3   CESAREAN SECTION     COLONOSCOPY WITH PROPOFOL  N/A 01/18/2022   Procedure: COLONOSCOPY WITH PROPOFOL ;  Surgeon: Shaaron Lamar HERO, MD;  Location: [facility];  Service: Endoscopy;  Laterality: N/A;  8:15 / ASA 2   KNEE ARTHROSCOPY Left  MASTECTOMY     MASTECTOMY W/ SENTINEL NODE BIOPSY Right 02/22/2020   Procedure: RIGHT MASTECTOMY WITH SENTINEL LYMPH NODE BIOPSY;  Surgeon: Belinda Cough, MD;  Location: [facility];  Service: General;  Laterality: Right;  PEC BLOCK   POLYPECTOMY  01/18/2022   Procedure: POLYPECTOMY;  Surgeon: Shaaron Lamar HERO, MD;  Location: [facility];  Service: Endoscopy;;   TONSILLECTOMY       Outpatient Encounter Medications as of 05/14/2024  Medication Sig   Cholecalciferol (VITAMIN D3) 125 MCG (5000 UT)  CAPS Take 5,000 Units by mouth daily at 6 (six) AM.   cyclobenzaprine (FLEXERIL) 10 MG tablet Take 10 mg by mouth at bedtime. As needed   ibuprofen  (ADVIL ) 800 MG tablet Take 800 mg by mouth daily as needed (fibromyalgia).   levocetirizine (XYZAL) 5 MG tablet Take 5 mg by mouth daily.   lisinopril  (ZESTRIL ) 20 MG tablet Take 20 mg by mouth daily.   loratadine (CLARITIN) 10 MG tablet Take 10 mg by mouth daily.   LORazepam (ATIVAN) 0.5 MG tablet Take 0.5 mg by mouth 2 (two) times daily as needed.   Semaglutide (OZEMPIC, 2 MG/DOSE, [place]) Inject into the skin once a week.   venlafaxine  XR (EFFEXOR -XR) 150 MG 24 hr capsule Take 150 mg by mouth daily.   [DISCONTINUED] anastrozole  (ARIMIDEX ) 1 MG tablet Take 1 tablet (1 mg total) by mouth daily.   [DISCONTINUED] venlafaxine  XR (EFFEXOR -XR) 75 MG 24 hr capsule Take 1 capsule (75 mg total) by mouth daily with breakfast.   anastrozole  (ARIMIDEX ) 1 MG tablet Take 1 tablet (1 mg total) by mouth daily.   diclofenac (VOLTAREN) 75 MG EC tablet Take 75 mg by mouth 2 (two) times daily.   lisinopril  (ZESTRIL ) 20 MG tablet Take 20 mg by mouth daily.   [DISCONTINUED] cyclobenzaprine (FLEXERIL) 10 MG tablet Take 10 mg by mouth.   No facility-administered encounter medications on file as of 05/14/2024.     Today's Vitals   05/14/24 1033 05/14/24 1039 05/14/24 1044  BP: (!) 160/120 (!) 160/100   Pulse:  99   Resp:  17   Temp:  97.6 F (36.4 C)   SpO2:  97%   Weight:  244 lb 12.8 oz (111 kg)   PainSc:   0-No pain   Body mass index is 38.34 kg/m.   ECOG PERFORMANCE STATUS: 0 - Asymptomatic  PHYSICAL EXAM GENERAL:alert, no distress and comfortable SKIN: no rash  EYES: sclera clear NECK: without mass LYMPH:  no palpable cervical or supraclavicular lymphadenopathy  LUNGS: normal breathing effort HEART: no lower extremity edema ABDOMEN: abdomen soft, non-tender and normal bowel sounds.  No RUQ TTP, hepatomegaly, or mass NEURO: alert & oriented x 3 with  fluent speech, no focal motor/sensory deficits Breast exam: S/p right mastectomy, incision completely healed with mild scar tissue.  No palpable mass or nodularity in the right chest wall, left breast, or either axilla that I could appreciate   CBC    Latest Ref Rng & Units 05/14/2024   10:15 AM 07/05/2023    8:04 AM 10/06/2022    9:27 AM  CBC  WBC 4.0 - 10.5 K/uL 7.6  8.1  7.1   Hemoglobin 12.0 - 15.0 g/dL 85.7  84.7  85.2   Hematocrit 36.0 - 46.0 % 41.5  44.9  42.1   Platelets 150 - 400 K/uL 191  271  236       CMP     Latest Ref Rng & Units 05/14/2024  10:15 AM 07/05/2023    8:04 AM 10/06/2022    9:27 AM  CMP  Glucose 70 - 99 mg/dL 893  869  889   BUN 8 - 23 mg/dL 17  11  19    Creatinine 0.44 - 1.00 mg/dL 9.16  9.24  9.35   Sodium 135 - 145 mmol/L 137  139  139   Potassium 3.5 - 5.1 mmol/L 4.3  4.0  4.3   Chloride 98 - 111 mmol/L 102  105  106   CO2 22 - 32 mmol/L 27  26  26    Calcium 8.9 - 10.3 mg/dL 89.6  9.8  9.7   Total Protein 6.5 - 8.1 g/dL 6.9  6.8  6.9   Total Bilirubin 0.0 - 1.2 mg/dL 0.6  0.7  0.6   Alkaline Phos 38 - 126 U/L 66  66  65   AST 15 - 41 U/L 55  31  28   ALT 0 - 44 U/L 76  53  41       ASSESSMENT & PLAN:Cynthia Shaw is a 63 y.o. female with        1. 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 Given her multifocal disease, she underwent right mastectomy with Dr. Belinda on 02/22/20. Her Oncotype RS was 23. Due to the low risk disease, adjuvant chemotherapy was not recommended.  She did not require post mastectomy radiation. -She started Exemestane  before her breast surgery on 01/22/20, insurance switched her to Anastrozole  in 04/2021 which she tolerates much better. Plan to continue for total of 7-10 years given her lobular carcinoma histology.  -Screening breast MRI 05/26/2021 was negative, repeat  q1-2 years; -Mammo 02/17/2024 was benign -Cynthia Shaw is clinically doing well. Exam is benign, labs are unremarkable except mildly elevated LFTs. Will ask PCP to repeat at next visit in 06/2024. If further elevated will proceed with abd US .  -Overall no clinical concern for breast cancer recurrence. Continue surveillance and AI, refilled -Lab and f/up in 6 months, or sooner if needed    2. H/o Cervical Cancer, On situ carcinoma, Genetic Testing (negative) -Dx 1990 and treated at [facility] in [place], [place]. Pap smears have been benign since.  -She has family history of Endometrial cancer, ADH, and colon cancer. -She has negative genetic testing: No pathogenic variants detected on the [facility] Breast Cancer STAT panel. The report date is 01/02/2020.   3. Anxiety/Depression, Hot flashes -She had been anxious and depressed about her cancer diagnosis and recent weight gain.  -She started Effexor  for mood and hot flashes from AI, this was very effective however she weaned off and started Cymbalta  for fibromyalgia -Cymbalta  was not effective and she is back on Effexor , dose recently increased by PCP     4. Arthritis, Weight gain, Fibromyalgia -She has chronic left hip and back pain since 2019. This resolved after shots to area and weight loss.  - Denies new or worsening pain   5. Smoking Cessation and health maintenance: - She was able to quit smoking in 02/2020.  -Last Pap 2 years ago, will update at upcoming annual wellness physical -Colonoscopy 01/18/2022 with Dr. Shaaron, 3 polyps removed (TA's); 5 year recall  -she also had derm screening last year, no biopsies   6. Bone Health, Vit D Deficiency  -She was on high dose vit D in the past, currently taking 2000 IU  daily. Will repeat at PCP this month - Recent DEXA per PCP, does not know results, she will ask PCP to fax    PLAN: -Recent mammo and today's reviewed -Continue breast cancer surveillance and Anastrozole , refilled -Pt  will ask PCP to fax us  DEXA results when they are available -Repeat LFTs at PCP visit in December -Lab and f/up in 6 months, or sooner if needed     All questions were answered. The patient knows to call the clinic with any problems, questions or concerns. No barriers to learning were detected.  Cynthia Sunde K Floyce Bujak, NP 05/14/2024

## 2024-05-15 ENCOUNTER — Ambulatory Visit: Payer: Self-pay | Admitting: Nurse Practitioner

## 2024-06-01 NOTE — Telephone Encounter (Addendum)
 Routed last office note and labs to Dr. Arlean Fairly as per Mayme Silversmith NP. Received confirmation of receipt of both.    ----- Message from Lacie K Burton sent at 05/15/2024  1:54 PM EDT ----- Please fax CMP and my note from yesterday to her PCP, requesting repeat LFTs at her next visit with Dr. Fairly.   Thanks Lacie NP ----- Message ----- From: Lanny Callander, MD Sent: 05/15/2024   8:26 AM EDT To: Lacie K Burton, NP   ----- Message ----- From: Rebecka, [facility] [place] Sent: 05/14/2024  10:26 AM EDT To: Callander Lanny, MD

## 2024-06-07 ENCOUNTER — Encounter: Payer: Self-pay | Admitting: Internal Medicine

## 2024-11-12 ENCOUNTER — Inpatient Hospital Stay: Admitting: Nurse Practitioner

## 2024-11-12 ENCOUNTER — Inpatient Hospital Stay
